# Patient Record
Sex: Male | Born: 1955 | Race: White | Hispanic: No | Marital: Married | Smoking: Never smoker
Health system: Southern US, Community
[De-identification: ages and names within clinical notes are randomized; demographics above are authoritative.]

## PROBLEM LIST (undated history)

## (undated) DIAGNOSIS — N400 Enlarged prostate without lower urinary tract symptoms: Secondary | ICD-10-CM

## (undated) DIAGNOSIS — F419 Anxiety disorder, unspecified: Secondary | ICD-10-CM

## (undated) DIAGNOSIS — M722 Plantar fascial fibromatosis: Secondary | ICD-10-CM

## (undated) DIAGNOSIS — I10 Essential (primary) hypertension: Secondary | ICD-10-CM

## (undated) DIAGNOSIS — B001 Herpesviral vesicular dermatitis: Secondary | ICD-10-CM

## (undated) DIAGNOSIS — K219 Gastro-esophageal reflux disease without esophagitis: Secondary | ICD-10-CM

## (undated) HISTORY — DX: Benign prostatic hyperplasia without lower urinary tract symptoms: N40.0

## (undated) HISTORY — DX: Gastro-esophageal reflux disease without esophagitis: K21.9

## (undated) HISTORY — DX: Essential (primary) hypertension: I10

## (undated) HISTORY — DX: Anxiety disorder, unspecified: F41.9

## (undated) HISTORY — DX: Plantar fascial fibromatosis: M72.2

## (undated) HISTORY — DX: Herpesviral vesicular dermatitis: B00.1

---

## 1998-12-22 ENCOUNTER — Encounter: Payer: Self-pay | Admitting: Gastroenterology

## 1998-12-22 ENCOUNTER — Ambulatory Visit (HOSPITAL_COMMUNITY): Admission: RE | Admit: 1998-12-22 | Discharge: 1998-12-22 | Payer: Self-pay | Admitting: Gastroenterology

## 1998-12-22 DIAGNOSIS — K449 Diaphragmatic hernia without obstruction or gangrene: Secondary | ICD-10-CM | POA: Insufficient documentation

## 1999-06-23 ENCOUNTER — Emergency Department (HOSPITAL_COMMUNITY): Admission: EM | Admit: 1999-06-23 | Discharge: 1999-06-23 | Payer: Self-pay | Admitting: *Deleted

## 2005-05-07 ENCOUNTER — Ambulatory Visit: Payer: Self-pay | Admitting: Gastroenterology

## 2005-06-14 ENCOUNTER — Ambulatory Visit: Payer: Self-pay | Admitting: Gastroenterology

## 2005-06-21 ENCOUNTER — Ambulatory Visit: Payer: Self-pay | Admitting: Gastroenterology

## 2005-06-21 DIAGNOSIS — K644 Residual hemorrhoidal skin tags: Secondary | ICD-10-CM | POA: Insufficient documentation

## 2007-09-04 ENCOUNTER — Ambulatory Visit: Payer: Self-pay | Admitting: Gastroenterology

## 2007-12-14 DIAGNOSIS — I10 Essential (primary) hypertension: Secondary | ICD-10-CM

## 2007-12-14 DIAGNOSIS — K219 Gastro-esophageal reflux disease without esophagitis: Secondary | ICD-10-CM

## 2007-12-14 DIAGNOSIS — K222 Esophageal obstruction: Secondary | ICD-10-CM

## 2008-03-11 ENCOUNTER — Telehealth: Payer: Self-pay | Admitting: Gastroenterology

## 2008-04-22 ENCOUNTER — Emergency Department (HOSPITAL_COMMUNITY): Admission: EM | Admit: 2008-04-22 | Discharge: 2008-04-22 | Payer: Self-pay | Admitting: Emergency Medicine

## 2009-01-09 ENCOUNTER — Ambulatory Visit: Payer: Self-pay | Admitting: Internal Medicine

## 2009-01-09 DIAGNOSIS — F411 Generalized anxiety disorder: Secondary | ICD-10-CM

## 2009-01-09 DIAGNOSIS — N400 Enlarged prostate without lower urinary tract symptoms: Secondary | ICD-10-CM

## 2009-02-05 ENCOUNTER — Ambulatory Visit: Payer: Self-pay | Admitting: Internal Medicine

## 2009-02-05 DIAGNOSIS — R209 Unspecified disturbances of skin sensation: Secondary | ICD-10-CM

## 2009-02-05 DIAGNOSIS — J301 Allergic rhinitis due to pollen: Secondary | ICD-10-CM

## 2009-02-07 ENCOUNTER — Telehealth: Payer: Self-pay | Admitting: Internal Medicine

## 2009-02-11 ENCOUNTER — Telehealth: Payer: Self-pay | Admitting: Internal Medicine

## 2009-05-12 ENCOUNTER — Telehealth: Payer: Self-pay | Admitting: Internal Medicine

## 2009-05-21 ENCOUNTER — Ambulatory Visit: Payer: Self-pay | Admitting: Internal Medicine

## 2009-05-21 DIAGNOSIS — M5106 Intervertebral disc disorders with myelopathy, lumbar region: Secondary | ICD-10-CM

## 2009-05-21 DIAGNOSIS — M549 Dorsalgia, unspecified: Secondary | ICD-10-CM | POA: Insufficient documentation

## 2009-06-04 ENCOUNTER — Telehealth: Payer: Self-pay | Admitting: Internal Medicine

## 2010-05-22 ENCOUNTER — Encounter: Payer: Self-pay | Admitting: Internal Medicine

## 2010-05-22 ENCOUNTER — Ambulatory Visit: Payer: Self-pay | Admitting: Internal Medicine

## 2010-05-22 LAB — CONVERTED CEMR LAB
ALT: 50 units/L (ref 0–53)
AST: 23 units/L (ref 0–37)
Albumin: 4.3 g/dL (ref 3.5–5.2)
Alkaline Phosphatase: 59 units/L (ref 39–117)
Basophils Absolute: 0 10*3/uL (ref 0.0–0.1)
Basophils Relative: 0.3 % (ref 0.0–3.0)
Bilirubin Urine: NEGATIVE
Bilirubin, Direct: 0.2 mg/dL (ref 0.0–0.3)
Cholesterol: 173 mg/dL (ref 0–200)
Eosinophils Absolute: 0 10*3/uL (ref 0.0–0.7)
Eosinophils Relative: 0.3 % (ref 0.0–5.0)
HCT: 47.2 % (ref 39.0–52.0)
HDL: 24.5 mg/dL — ABNORMAL LOW (ref >39.00)
Hemoglobin, Urine: NEGATIVE
Hemoglobin: 16.5 g/dL (ref 13.0–17.0)
Ketones, ur: NEGATIVE mg/dL
LDL Cholesterol: 113 mg/dL — ABNORMAL HIGH (ref 0–99)
Leukocytes, UA: NEGATIVE
Lymphocytes Relative: 20.6 % (ref 12.0–46.0)
Lymphs Abs: 1.9 10*3/uL (ref 0.7–4.0)
MCHC: 35 g/dL (ref 30.0–36.0)
MCV: 85.7 fL (ref 78.0–100.0)
Monocytes Absolute: 0.8 10*3/uL (ref 0.1–1.0)
Monocytes Relative: 8.2 % (ref 3.0–12.0)
Neutro Abs: 6.6 10*3/uL (ref 1.4–7.7)
Neutrophils Relative %: 70.6 % (ref 43.0–77.0)
Nitrite: NEGATIVE
PSA: 0.77 ng/mL (ref 0.10–4.00)
Platelets: 220 10*3/uL (ref 150.0–400.0)
RBC: 5.51 M/uL (ref 4.22–5.81)
RDW: 13.1 % (ref 11.5–14.6)
Specific Gravity, Urine: 1.025 (ref 1.000–1.030)
TSH: 1.3 microintl units/mL (ref 0.35–5.50)
Total Bilirubin: 1 mg/dL (ref 0.3–1.2)
Total CHOL/HDL Ratio: 7
Total Protein, Urine: NEGATIVE mg/dL
Total Protein: 6.7 g/dL (ref 6.0–8.3)
Triglycerides: 180 mg/dL — ABNORMAL HIGH (ref 0.0–149.0)
Urine Glucose: NEGATIVE mg/dL
Urobilinogen, UA: 0.2 (ref 0.0–1.0)
VLDL: 36 mg/dL (ref 0.0–40.0)
WBC: 9.4 10*3/uL (ref 4.5–10.5)
pH: 6.5 (ref 5.0–8.0)

## 2010-11-16 ENCOUNTER — Encounter: Payer: Self-pay | Admitting: Internal Medicine

## 2010-11-24 NOTE — Letter (Signed)
Summary: Lipid Letter  [facility]  [address] [place], [place] [address]   Phone: [phone]  Fax: [phone]    05/22/2010  Bradley Conner [address] [place], [place]  [address]  Dear Jonny Ruiz:  We have carefully reviewed your last lipid profile from 05/22/2010 and the results are noted below with a summary of recommendations for lipid management.    Cholesterol:       173     Goal: <200   HDL "good" Cholesterol:   57.84     Goal: >40   LDL "bad" Cholesterol:   113     Goal: <130   Triglycerides:       180.0     Goal: <150    other labs look normal, the triglycerides are too high    TLC Diet (Therapeutic Lifestyle Change): Saturated Fats & Transfatty acids should be kept < 7% of total calories ***Reduce Saturated Fats Polyunstaurated Fat can be up to 10% of total calories Monounsaturated Fat Fat can be up to 20% of total calories Total Fat should be no greater than 25-35% of total calories Carbohydrates should be 50-60% of total calories Protein should be approximately 15% of total calories Fiber should be at least 20-30 grams a day ***Increased fiber may help lower LDL Total Cholesterol should be < 200mg /day Consider adding plant stanol/sterols to diet (example: Benacol spread) ***A higher intake of unsaturated fat may reduce Triglycerides and Increase HDL    Adjunctive Measures (may lower LIPIDS and reduce risk of Heart Attack) include: Aerobic Exercise (20-30 minutes 3-4 times a week) Limit Alcohol Consumption Weight Reduction Aspirin 75-81 mg a day by mouth (if not allergic or contraindicated) Dietary Fiber 20-30 grams a day by mouth     Current Medications: 1)    Omeprazole 20 Mg  Cpdr (Omeprazole) .Marland Kitchen.. 1 each day 30 minutes before meal 2)    Lisinopril 40 Mg Tabs (Lisinopril) .... Take 1 tablet by mouth once a day 3)    Valtrex 1 Gm Tabs (Valacyclovir hcl) .... One by mouth bid 4)    Lodrane 24d 12-90 Mg Xr24h-cap (Brompheniramine-pseudoeph)  .Marland Kitchen.. 1-2 by mouth once daily as needed nasal congesion 5)    Vicodin Es 7.5-750 Mg Tabs (Hydrocodone-acetaminophen) .... One by mouth qid as needed for pain  If you have any questions, please call. We appreciate being able to work with you.   Sincerely,    [facility] Etta Grandchild MD

## 2010-11-24 NOTE — Assessment & Plan Note (Signed)
Summary: new / cpx / will come fasting/cd   Vital Signs:  Patient profile:   55 year old male Height:      67 inches Weight:      193 pounds BMI:     30.34 O2 Sat:      98 % on Room air Temp:     98.4 degrees F oral Pulse rate:   55 / minute Pulse rhythm:   regular Resp:     16 per minute BP sitting:   126 / 80  (left arm) Cuff size:   large  Vitals Entered By: Rock Nephew CMA (May 22, 2010 9:01 AM)  Nutrition Counseling: Patient's BMI is greater than 25 and therefore counseled on weight management options.  O2 Flow:  Room air CC: Pt here for CPX w/ labs also get med refills, Abdominal Pain, Preventive Care Is Patient Diabetic? No Pain Assessment Patient in pain? no        Primary Care Provider:  Etta Grandchild MD  CC:  Pt here for CPX w/ labs also get med refills, Abdominal Pain, and Preventive Care.  History of Present Illness: He returns for a complete physical and he says that he saw  Dr. Farris Has, a pain specialst, this week and he got some steroid shots.  Dyspepsia History:      He has no alarm features of dyspepsia including no history of melena, hematochezia, dysphagia, persistent vomiting, or involuntary weight loss > 5%.  There is a prior history of GERD.  The patient does not have a prior history of documented ulcer disease.  The dominant symptom is heartburn or acid reflux.  An H-2 blocker medication is currently being taken.  He notes that the symptoms have improved with the H-2 blocker therapy.  Symptoms have not persisted after 4 weeks of H-2 blocker treatment.     Preventive Screening-Counseling & Management  Alcohol-Tobacco     Alcohol drinks/day: <1     Alcohol type: spirits     >5/day in last 3 mos: no     Alcohol Counseling: not indicated; use of alcohol is not excessive or problematic     Feels need to cut down: no     Feels annoyed by complaints: no     Feels guilty re: drinking: no     Needs 'eye opener' in am: no     Smoking Status:  never  Hep-HIV-STD-Contraception     Hepatitis Risk: no risk noted     HIV Risk: no risk noted     STD Risk: no risk noted  Safety-Violence-Falls     Seat Belt Use: yes     Helmet Use: yes     Firearms in the Home: no firearms in the home     Smoke Detectors: yes     Violence in the Home: no risk noted     Sexual Abuse: no      Sexual History:  currently monogamous.        Drug Use:  never.        Blood Transfusions:  no.    Medications Prior to Update: 1)  Omeprazole 20 Mg  Cpdr (Omeprazole) .Marland Kitchen.. 1 Each Day 30 Minutes Before Meal 2)  Lisinopril 40 Mg Tabs (Lisinopril) .... Take 1 Tablet By Mouth Once A Day 3)  Valtrex 1 Gm Tabs (Valacyclovir Hcl) .... One By Mouth Bid 4)  Lorazepam 0.5 Mg Tabs (Lorazepam) .... One By Mouth Three Times A Day Orn Anxiety 5)  Lodrane  24d 12-90 Mg Xr24h-Cap (Brompheniramine-Pseudoeph) .Marland Kitchen.. 1-2 By Mouth Once Daily As Needed Nasal Congesion 6)  Hydrocodone-Homatropine 5-1.5 Mg/88ml Syrp (Hydrocodone-Homatropine) .... 5 Ml By Mouth Q 6 Hours As Needed Cough 7)  Vicodin Es 7.5-750 Mg Tabs (Hydrocodone-Acetaminophen) .... One By Mouth Qid As Needed For Pain 8)  Diclofenac Sodium 50 Mg Tbec (Diclofenac Sodium) .Marland Kitchen.. 1 By Mouth Qid As Needed For Low Back Pain  Current Medications (verified): 1)  Omeprazole 20 Mg  Cpdr (Omeprazole) .Marland Kitchen.. 1 Each Day 30 Minutes Before Meal 2)  Lisinopril 40 Mg Tabs (Lisinopril) .... Take 1 Tablet By Mouth Once A Day 3)  Valtrex 1 Gm Tabs (Valacyclovir Hcl) .... One By Mouth Bid 4)  Lodrane 24d 12-90 Mg Xr24h-Cap (Brompheniramine-Pseudoeph) .Marland Kitchen.. 1-2 By Mouth Once Daily As Needed Nasal Congesion 5)  Vicodin Es 7.5-750 Mg Tabs (Hydrocodone-Acetaminophen) .... One By Mouth Qid As Needed For Pain  Allergies (verified): 1)  Sulfamethoxazole (Sulfamethoxazole)  Past History:  Past Medical History: Last updated: 01/09/2009 GERD Hypertension Genital herpes Cold sores Palntar Fasciitis Anxiety Benign prostatic  hypertrophy  Past Surgical History: Last updated: 01/09/2009 Denies surgical history  Family History: Last updated: 01/09/2009 Family History of Arthritis Family History Hypertension  Social History: Last updated: 01/09/2009 Occupation: Curator Married Never Smoked Alcohol use-yes Drug use-no Regular exercise-yes  Risk Factors: Alcohol Use: <1 (05/22/2010) >5 drinks/d w/in last 3 months: no (05/22/2010) Exercise: yes (01/09/2009)  Risk Factors: Smoking Status: never (05/22/2010)  Family History: Reviewed history from 01/09/2009 and no changes required. Family History of Arthritis Family History Hypertension  Social History: Reviewed history from 01/09/2009 and no changes required. Occupation: Curator Married Never Smoked Alcohol use-yes Drug use-no Regular Research scientist (life sciences) Use:  yes Drug Use:  never  Review of Systems       The patient complains of weight gain.  The patient denies anorexia, fever, weight loss, hoarseness, chest pain, syncope, dyspnea on exertion, peripheral edema, prolonged cough, headaches, hemoptysis, abdominal pain, melena, hematochezia, severe indigestion/heartburn, hematuria, incontinence, muscle weakness, suspicious skin lesions, difficulty walking, depression, unusual weight change, abnormal bleeding, enlarged lymph nodes, angioedema, and testicular masses.   GU:  Denies decreased libido, discharge, dysuria, erectile dysfunction, genital sores, hematuria, incontinence, nocturia, urinary frequency, and urinary hesitancy. MS:  Complains of low back pain; denies joint pain, joint redness, joint swelling, loss of strength, muscle aches, and muscle weakness.  Physical Exam  General:  Well-developed, well-nourished, in no acute distress; alert and oriented x 3.   Head:  normocephalic and atraumatic.   Eyes:  vision grossly intact, pupils equal, pupils round, and pupils reactive to light.   Nose:  External nasal examination shows no  deformity or inflammation. Nasal mucosa are pink and moist without lesions or exudates. Mouth:  Oral mucosa and oropharynx without lesions or exudates.  Teeth in good repair. Neck:  supple, full ROM, no masses, no cervical lymphadenopathy, and no neck tenderness.   Breasts:  no gynecomastia, no masses, no adenopathy, no swelling, no nipple discharge, no tenderness, and no inflammation.   Lungs:  Normal respiratory effort, chest expands symmetrically. Lungs are clear to auscultation, no crackles or wheezes. Heart:  Normal rate and regular rhythm. S1 and S2 normal without gallop, murmur, click, rub or other extra sounds. Abdomen:  Bowel sounds positive,abdomen soft and non-tender without masses, organomegaly or hernias noted. Rectal:  normal sphincter tone, no masses, no tenderness, no fissures, no fistulae, no perianal rash, and external hemorrhoid(s).   Genitalia:  uncircumcised, no hydrocele, no varicocele, no  scrotal masses, no testicular masses or atrophy, no cutaneous lesions, and no urethral discharge.   Prostate:  no nodules, no asymmetry, no induration, and 1+ enlarged.   Msk:  normal ROM, no joint tenderness, no joint swelling, no joint warmth, no redness over joints, no joint deformities, no joint instability, no crepitation, and no muscle atrophy.   Pulses:  R and L carotid,radial,femoral,dorsalis pedis and posterior tibial pulses are full and equal bilaterally Extremities:  No clubbing, cyanosis, edema, or deformity noted with normal full range of motion of all joints.   Neurologic:  No cranial nerve deficits noted. Station and gait are normal. Plantar reflexes are down-going bilaterally. DTRs are symmetrical throughout. Sensory, motor and coordinative functions appear intact. Skin:  turgor normal, color normal, no rashes, no suspicious lesions, no ecchymoses, no petechiae, no purpura, no ulcerations, and no edema.   Cervical Nodes:  no anterior cervical adenopathy and no posterior cervical  adenopathy.   Axillary Nodes:  no R axillary adenopathy and no L axillary adenopathy.   Inguinal Nodes:  no R inguinal adenopathy and no L inguinal adenopathy.   Psych:  Cognition and judgment appear intact. Alert and cooperative with normal attention span and concentration. No apparent delusions, illusions, hallucinations   Impression & Recommendations:  Problem # 1:  ROUTINE GENERAL MEDICAL EXAM@HEALTH  CARE FACL (ICD-V70.0) Assessment New  Orders: Venipuncture (83151) TLB-Lipid Panel (80061-LIPID) TLB-CBC Platelet - w/Differential (85025-CBCD) TLB-Hepatic/Liver Function Pnl (80076-HEPATIC) TLB-TSH (Thyroid Stimulating Hormone) (84443-TSH) TLB-PSA (Prostate Specific Antigen) (84153-PSA) TLB-Udip w/ Micro (81001-URINE) EKG w/ Interpretation (93000) Hemoccult Guaiac-1 spec.(in office) (76160)  Colonoscopy: done (06/21/2005) Td Booster: Historical (10/25/2009)    Discussed using sunscreen, use of alcohol, drug use, self testicular exam, routine dental care, routine eye care, routine physical exam, seat belts, multiple vitamins, and recommendations for immunizations.  Discussed exercise and checking cholesterol.   Also recommend checking PSA.  Complete Medication List: 1)  Omeprazole 20 Mg Cpdr (Omeprazole) .Marland Kitchen.. 1 each day 30 minutes before meal 2)  Lisinopril 40 Mg Tabs (Lisinopril) .... Take 1 tablet by mouth once a day 3)  Valtrex 1 Gm Tabs (Valacyclovir hcl) .... One by mouth bid 4)  Lodrane 24d 12-90 Mg Xr24h-cap (Brompheniramine-pseudoeph) .Marland Kitchen.. 1-2 by mouth once daily as needed nasal congesion 5)  Vicodin Es 7.5-750 Mg Tabs (Hydrocodone-acetaminophen) .... One by mouth qid as needed for pain  Dyspepsia Assessment/Plan:  Step Therapy: GERD Treatment Protocols:    Step-1: improved  Colorectal Screening:  Current Recommendations:    Hemoccult: NEG X 1 today  PSA Screening:    Reviewed PSA screening recommendations: PSA ordered  Immunization & Chemoprophylaxis:     Tetanus vaccine: Historical  (10/25/2009)  Patient Instructions: 1)  It is important that you exercise regularly at least 20 minutes 5 times a week. If you develop chest pain, have severe difficulty breathing, or feel very tired , stop exercising immediately and seek medical attention. 2)  You need to lose weight. Consider a lower calorie diet and regular exercise.  3)  Check your Blood Pressure regularly. If it is above 140/90: you should make an appointment. 4)  Please schedule a follow-up appointment in 4 months. Prescriptions: VICODIN ES 7.5-750 MG TABS (HYDROCODONE-ACETAMINOPHEN) One by mouth QID as needed for pain  #50 x 1   Entered and Authorized by:   Etta Grandchild MD   Signed by:   Etta Grandchild MD on 05/22/2010   Method used:   Print then Give to Patient   RxID:  [id number] VALTREX 1 GM TABS (VALACYCLOVIR HCL) one by mouth bid  #60 x 11   Entered and Authorized by:   Etta Grandchild MD   Signed by:   Etta Grandchild MD on 05/22/2010   Method used:   Print then Give to Patient   RxID:   [id number] LISINOPRIL 40 MG TABS (LISINOPRIL) Take 1 tablet by mouth once a day  #30 Tablet x 11   Entered and Authorized by:   Etta Grandchild MD   Signed by:   Etta Grandchild MD on 05/22/2010   Method used:   Print then Give to Patient   RxID:   [id number] OMEPRAZOLE 20 MG  CPDR (OMEPRAZOLE) 1 each day 30 minutes before meal  #30 Capsule x 11   Entered and Authorized by:   Etta Grandchild MD   Signed by:   Etta Grandchild MD on 05/22/2010   Method used:   Print then Give to Patient   RxID:   [id number]   Preventive Care Screening  Last Tetanus Booster:    Date:  10/25/2009    Results:  Historical   Colonoscopy:    Date:  06/21/2005    Results:  done

## 2010-11-30 ENCOUNTER — Encounter (HOSPITAL_BASED_OUTPATIENT_CLINIC_OR_DEPARTMENT_OTHER)
Admission: RE | Admit: 2010-11-30 | Discharge: 2010-11-30 | Disposition: A | Discharge status: Home / Self Care | Payer: 59 | Source: Ambulatory Visit | Attending: Orthopedic Surgery | Admitting: Orthopedic Surgery

## 2010-11-30 DIAGNOSIS — Z01812 Encounter for preprocedural laboratory examination: Secondary | ICD-10-CM | POA: Insufficient documentation

## 2010-11-30 LAB — BASIC METABOLIC PANEL
BUN: 11 mg/dL (ref 6–23)
CO2: 28 mEq/L (ref 19–32)
Calcium: 9 mg/dL (ref 8.4–10.5)
Chloride: 106 mEq/L (ref 96–112)
Creatinine, Ser: 1 mg/dL (ref 0.4–1.5)
GFR calc Af Amer: >60 mL/min (ref >60)
GFR calc non Af Amer: >60 mL/min (ref >60)
Glucose, Bld: 94 mg/dL (ref 70–99)
Potassium: 4.8 mEq/L (ref 3.5–5.1)
Sodium: 141 mEq/L (ref 135–145)

## 2010-12-03 ENCOUNTER — Ambulatory Visit (HOSPITAL_BASED_OUTPATIENT_CLINIC_OR_DEPARTMENT_OTHER)
Admission: RE | Admit: 2010-12-03 | Discharge: 2010-12-03 | Disposition: A | Discharge status: Home / Self Care | Payer: 59 | Source: Ambulatory Visit | Attending: Orthopedic Surgery | Admitting: Orthopedic Surgery

## 2010-12-03 DIAGNOSIS — M7500 Adhesive capsulitis of unspecified shoulder: Secondary | ICD-10-CM | POA: Insufficient documentation

## 2010-12-03 DIAGNOSIS — M25819 Other specified joint disorders, unspecified shoulder: Secondary | ICD-10-CM | POA: Insufficient documentation

## 2010-12-03 DIAGNOSIS — M948X9 Other specified disorders of cartilage, unspecified sites: Secondary | ICD-10-CM | POA: Insufficient documentation

## 2010-12-03 DIAGNOSIS — M719 Bursopathy, unspecified: Secondary | ICD-10-CM | POA: Insufficient documentation

## 2010-12-03 DIAGNOSIS — Z01818 Encounter for other preprocedural examination: Secondary | ICD-10-CM | POA: Insufficient documentation

## 2010-12-03 DIAGNOSIS — M67919 Unspecified disorder of synovium and tendon, unspecified shoulder: Secondary | ICD-10-CM | POA: Insufficient documentation

## 2010-12-03 DIAGNOSIS — Z01812 Encounter for preprocedural laboratory examination: Secondary | ICD-10-CM | POA: Insufficient documentation

## 2010-12-14 NOTE — Op Note (Signed)
  NAME:  AMARU, BURROUGHS NO.:  [id number]  MEDICAL RECORD NO.:  [id number]          PATIENT TYPE:  LOCATION:                                 FACILITY:  PHYSICIAN:  Loreta Ave, M.D.      DATE OF BIRTH:  DATE OF PROCEDURE:  12/03/2010 DATE OF DISCHARGE:                              OPERATIVE REPORT   PREOPERATIVE DIAGNOSES:  Left shoulder subacromial impingement, rotator cuff tendonitis, marked distal clavicle osteolysis.  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  PROCEDURES:  Left shoulder exam, manipulation under anesthesia. Arthroscopy with lysis and debridement of the intra and extra-articular adhesions and synovitis.  Debridement of small anterior labrum tear. Bursectomy, acromioplasty, coracoacromial ligament release.  Excision of distal clavicle.  SURGEON:  Loreta Ave, MD  ASSISTANT:  Genene Churn. Barry Dienes, [place], present throughout the entire case and necessary for timely completion of procedure.  ANESTHESIA:  General.  BLOOD LOSS:  Minimal.  SPECIMEN:  None.  CULTURE:  None.  COMPLICATIONS:  None.  DRESSING:  Soft compressive with sling.  PROCEDURE:  The patient was brought to the operating room, placed on the operating table in supine position.  After adequate anesthesia has been obtained, left shoulder examined.  He lacked 40% of motion especially trying to go overhead.  Relatively abrupt endpoint.  Considerably stiffer and more frozen than when I saw him one month ago.  He was manipulated, break up obvious adhesions achieving full motion, stable shoulder.  Placed in a beach-chair position on the shoulder positioner and prepped and draped in the usual sterile fashion.  Three portals, anterior, posterior, and lateral.  Arthroscope was then introduced,  the shoulder was distended and inspected.  Marked intra-articular synovitis adhesions debrided.  The scarring and tearing of the inferior capsule obvious from manipulation as was necessary.  Debridement of small tearing of anterior labrum.  Biceps tendon, undersurface cuff, articular cartilage all looked good.  After clearing out the inside of the shoulder, it looked subacromial.  Type 3 acromion, marked reactive bursitis, adhesions there.  Bursa resected, cuff debrided, and adhesions removed.  Some roughening on the top of the cuff but structurally intact.  Acromioplasty from type 3 to type 1 acromion with shaver and high-speed bur releasing the CA ligament.  Grade 4 changes and marked spurring AC joint.  Periarticular spurs and lateral centimeter of clavicle resected.  Adequacy of decompression, debridement confirmed viewing from all portals.  Instruments and fluid removed.  Portals and shoulder bursa injected with Marcaine.  Portals closed with 4-0 nylon. Sterile compressive dressing applied.  Sling applied.  Anesthesia reversed.  Brought to recovery room.  Tolerated surgery well.  No complications.     Loreta Ave, M.D.     DFM/MEDQ  D:  12/03/2010  T:  12/04/2010  Job:  [id number]  Electronically Signed by Mckinley Jewel M.D. on 12/14/2010 02:58:30 PM

## 2011-02-05 ENCOUNTER — Ambulatory Visit (INDEPENDENT_AMBULATORY_CARE_PROVIDER_SITE_OTHER): Payer: 59 | Admitting: Internal Medicine

## 2011-02-05 ENCOUNTER — Encounter: Payer: Self-pay | Admitting: Internal Medicine

## 2011-02-05 VITALS — BP 126/88 | HR 68 | Temp 98.5°F | Resp 16 | Ht 67.0 in | Wt 200.5 lb

## 2011-02-05 DIAGNOSIS — J309 Allergic rhinitis, unspecified: Secondary | ICD-10-CM

## 2011-02-05 DIAGNOSIS — I10 Essential (primary) hypertension: Secondary | ICD-10-CM

## 2011-02-05 MED ORDER — FLUTICASONE PROPIONATE 50 MCG/ACT NA SUSP: 2.0000 | Freq: Every day | NASAL | Status: DC

## 2011-02-05 NOTE — Progress Notes (Signed)
Subjective:    Patient ID: Bradley Conner, male    DOB: Apr 29, 1956, 55 y.o.   MRN: [id number]  Allergic Reaction This is a chronic problem. The current episode started 5 to 7 days ago. The problem occurs daily. The problem has been gradually worsening since onset. The problem is moderate. The time of exposure was just prior to onset. Associated symptoms include eye itching and eye watering. Pertinent negatives include no abdominal pain, chest pain, chest pressure, coughing, diarrhea, difficulty breathing, drooling, eye redness, globus sensation, hyperventilation, itching, rash, stridor, trouble swallowing, vomiting or wheezing. There is no swelling present. Past treatments include one or more OTC medications and diphenhydramine. The treatment provided mild relief. His past medical history is significant for seasonal allergies. There is no history of asthma, atopic dermatitis, food allergies or medication allergies.      Review of Systems  Constitutional: Negative for fever, chills, diaphoresis, activity change, appetite change, fatigue and unexpected weight change.  HENT: Positive for congestion, rhinorrhea, sneezing and postnasal drip. Negative for hearing loss, ear pain, nosebleeds, sore throat, facial swelling, drooling, trouble swallowing, neck pain, neck stiffness, voice change, sinus pressure and ear discharge.   Eyes: Positive for itching. Negative for redness.  Respiratory: Negative for apnea, cough, choking, chest tightness, shortness of breath, wheezing and stridor.   Cardiovascular: Negative for chest pain, palpitations and leg swelling.  Gastrointestinal: Negative for nausea, vomiting, abdominal pain, diarrhea, constipation, blood in stool and anal bleeding.  Musculoskeletal: Negative for myalgias, back pain, joint swelling, arthralgias and gait problem.  Skin: Negative for color change, itching, pallor and rash.  Neurological: Negative for dizziness, tremors, seizures, syncope, facial  asymmetry, speech difficulty, weakness, light-headedness, numbness and headaches.  Hematological: Negative for adenopathy. Does not bruise/bleed easily.  Psychiatric/Behavioral: Negative for dysphoric mood, decreased concentration and agitation. The patient is not nervous/anxious.        Objective:   Physical Exam  [nursing notereviewed. Constitutional: He is oriented to person, place, and time. He appears well-developed and well-nourished. No distress.  HENT:  Head: Normocephalic and atraumatic.  Right Ear: External ear normal.  Left Ear: External ear normal.  Nose: Mucosal edema and rhinorrhea present. No sinus tenderness, nasal deformity, septal deviation or nasal septal hematoma. No epistaxis.  No foreign bodies. Right sinus exhibits no maxillary sinus tenderness and no frontal sinus tenderness. Left sinus exhibits no maxillary sinus tenderness and no frontal sinus tenderness.  Mouth/Throat: Oropharynx is clear and moist. No oropharyngeal exudate.  Eyes: Conjunctivae and EOM are normal. Pupils are equal, round, and reactive to light. Right eye exhibits no discharge. Left eye exhibits no discharge. No scleral icterus.  Neck: Normal range of motion. Neck supple. No JVD present. No tracheal deviation present. No thyromegaly present.  Cardiovascular: Normal rate, regular rhythm, normal heart sounds and intact distal pulses.  Exam reveals no gallop and no friction rub.   No murmur heard. Pulmonary/Chest: Effort normal and breath sounds normal. No respiratory distress. He has no wheezes. He has no rales. He exhibits no tenderness.  Abdominal: Soft. Bowel sounds are normal. He exhibits no distension and no mass. There is no tenderness. There is no rebound and no guarding.  Musculoskeletal: Normal range of motion. He exhibits no edema and no tenderness.  Lymphadenopathy:    He has no cervical adenopathy.  Neurological: He is alert and oriented to person, place, and time. He has normal reflexes.    Skin: Skin is warm and dry. No rash noted. He is not diaphoretic. No  erythema. No pallor.  Psychiatric: He has a normal mood and affect. His behavior is normal. Judgment and thought content normal.       Lab Results  Component Value Date   WBC 9.4 05/22/2010   HGB 17.3* 12/03/2010   HCT 47.2 05/22/2010   PLT 220.0 05/22/2010   CHOL 173 05/22/2010   TRIG 180.0* 05/22/2010   HDL 24.50* 05/22/2010   ALT 50 05/22/2010   AST 23 05/22/2010   NA 141 11/30/2010   K 4.8 11/30/2010   CL 106 11/30/2010   CREATININE 1.00 11/30/2010   BUN 11 11/30/2010   CO2 28 11/30/2010   TSH 1.30 05/22/2010   PSA 0.77 05/22/2010      Assessment & Plan:

## 2011-02-05 NOTE — Patient Instructions (Signed)
Allergic Rhinitis Allergic rhinitis is when the mucous membranes in the nose respond to allergens. Allergens are particles in the air that cause your body to have an allergic reaction. This causes you to release allergic antibodies. Through a chain of events, these eventually cause you to release histamine into the blood stream (hence the use of antihistamines). Although meant to be protective to the body, it is this release that causes your discomfort, such as frequent sneezing, congestion and an itchy runny nose.  CAUSES The pollen allergens may come from grasses, trees, and weeds. This is seasonal allergic rhinitis, or "hay fever." Other allergens cause year-round allergic rhinitis (perennial allergic rhinitis) such as house dust mite allergen, pet dander and mold spores.  SYMPTOMS  Nasal stuffiness (congestion).   Runny, itchy nose with sneezing and tearing of the eyes.   There is often an itching of the mouth, eyes and ears.  It cannot be cured, but it can be controlled with medications. DIAGNOSIS If you are unable to determine the offending allergen, skin or blood testing may find it. TREATMENT  Avoid the allergen.   Medications and allergy shots (immunotherapy) can help.   Hay fever may often be treated with antihistamines in pill or nasal spray forms. Antihistamines block the effects of histamine. There are over-the-counter medicines that may help with nasal congestion and swelling around the eyes. Check with your caregiver before taking or giving this medicine.  If the treatment above does not work, there are many new medications your caregiver can prescribe. Stronger medications may be used if initial measures are ineffective. Desensitizing injections can be used if medications and avoidance fails. Desensitization is when a patient is given ongoing shots until the body becomes less sensitive to the allergen. Make sure you follow up with your caregiver if problems continue. SEEK  MEDICAL CARE IF:   You develop fever (more than 100.5F (38.1 C).   You develop a cough that does not stop easily (persistent).   You have shortness of breath.   You start wheezing.   Symptoms interfere with normal daily activities.  Document Released: 07/06/2001 Document Re-Released: 11/02/2009 [employer] Patient Information 2011 [employer]. 

## 2011-02-05 NOTE — Assessment & Plan Note (Signed)
His BP is well controlled 

## 2011-02-05 NOTE — Assessment & Plan Note (Signed)
Due to symptom severity and failure of routine meds he was given a dose of depo-medrol and started on flonase ns.

## 2011-03-09 NOTE — Assessment & Plan Note (Signed)
HEALTHCARE                         GASTROENTEROLOGY OFFICE NOTE   BRYSON, GAVIA                       MRN:          [id number]  DATE:09/04/2007                            DOB:          [birth year]    HISTORY:  Mr. Haven is a 55 year old white male who has a long history  of acid reflux which required endoscopic evaluation with esophageal  dilatation in February 2008.  Since that time, he has been on regular  PPI therapy.  He was actually last seen in our offices in July 2006.  He  was doing well at that time.  He had some asymptomatic rectal bleeding  and underwent a colonoscopy on June 21, 2005, which showed some  external hemorrhoids, but otherwise was negative.   The patient has been taking Aciphex every other day and recently ran out  of his medication.  He had recurrent burning substernal chest pain, but  denies true dysphagia.  He denies any other gastrointestinal symptoms  since his abdominal pain, change in bowel habits, melena or  hematochezia.  He follows a regular diet and has no specific food  intolerances.  He is followed at [facility] and [facility] and  recently has been diagnosed as having mild essential hypertension.  He  is on an unknown blood pressure pill.   PAST MEDICAL HISTORY:  Is otherwise noncontributory.   FAMILY HISTORY:  Noncontributory.   SOCIAL HISTORY:  He is married and lives with his wife.  He has a  Restaurant manager, fast food position.  He does not smoke and uses ethanol socially.   REVIEW OF SYSTEMS:  Noncontributory.   CURRENT MEDICATIONS:  1. Aciphex 20 mg daily.  2. An unknown blood pressure pill.   ALLERGIES:  He denies allergy except to SULFA MEDICATIONS.   PHYSICAL EXAMINATION:  GENERAL:  He is a healthy-appearing white male,  in no distress, appearing his stated age.  HEENT:  I cannot appreciate stigmata of chronic liver disease.  VITAL SIGNS:  He is 5 feet 7 inches in height, weight 200 pounds.   Blood  pressure 130/84, pulse 62 and regular.  CHEST:  Clear.  HEART:  A regular rhythm without murmurs, gallops or rubs.  ABDOMEN:  There was no hepatosplenomegaly, abdominal masses or  tenderness.  Bowel sounds were normal.  NEUROLOGIC:  Mental status clear.  EXTREMITIES:  Peripheral extremities were unremarkable.   ASSESSMENT/PLAN:  1. Chronic acid reflux, doing well on regular proton pump inhibitor      therapy.  There has been no previous evidence of Barrett's mucosa,      and the patient does not have dysphagia at this time.  2. The patient is up-to-date on his colonoscopy examinations.  3. New onset hypertension which appears well-controlled at this time.      The patient relates that he has had recent blood work done at [facility].  We will request a copy of this for our records.   RECOMMENDATIONS:  1. Reflux regimen again reviewed with the patient.  2. Continue Aciphex 20 mg, 30 minutes before his  first meal of the      day.  3. GI followup p.r.n.     Vania Rea. Jarold Motto, MD, Caleen Essex, FAGA  Electronically Signed    DRP/MedQ  DD: 09/04/2007  DT: 09/04/2007  Job #: [id number]   cc:   [facility]

## 2011-04-07 ENCOUNTER — Other Ambulatory Visit (INDEPENDENT_AMBULATORY_CARE_PROVIDER_SITE_OTHER): Payer: 59

## 2011-04-07 ENCOUNTER — Encounter: Payer: Self-pay | Admitting: Internal Medicine

## 2011-04-07 ENCOUNTER — Ambulatory Visit (INDEPENDENT_AMBULATORY_CARE_PROVIDER_SITE_OTHER): Payer: 59 | Admitting: Internal Medicine

## 2011-04-07 VITALS — BP 122/80 | HR 58 | Temp 98.0°F | Resp 16 | Wt 196.0 lb

## 2011-04-07 DIAGNOSIS — E782 Mixed hyperlipidemia: Secondary | ICD-10-CM

## 2011-04-07 DIAGNOSIS — I10 Essential (primary) hypertension: Secondary | ICD-10-CM

## 2011-04-07 LAB — LIPID PANEL
HDL: 22.7 mg/dL — ABNORMAL LOW (ref >39.00)
LDL Cholesterol: 94 mg/dL (ref 0–99)
Total CHOL/HDL Ratio: 7
Triglycerides: 177 mg/dL — ABNORMAL HIGH (ref 0.0–149.0)
VLDL: 35.4 mg/dL (ref 0.0–40.0)

## 2011-04-07 LAB — COMPREHENSIVE METABOLIC PANEL
ALT: 29 U/L (ref 0–53)
AST: 23 U/L (ref 0–37)
Alkaline Phosphatase: 77 U/L (ref 39–117)
Creatinine, Ser: 1 mg/dL (ref 0.4–1.5)
Total Bilirubin: 0.7 mg/dL (ref 0.3–1.2)

## 2011-04-07 NOTE — Progress Notes (Signed)
Subjective:    Patient ID: Bradley Conner, male    DOB: January 10, 1956, 55 y.o.   MRN: [id number]  Hypertension This is a chronic problem. The current episode started more than 1 year ago. The problem has been gradually improving since onset. The problem is controlled. Pertinent negatives include no anxiety, blurred vision, chest pain, headaches, malaise/fatigue, neck pain, orthopnea, palpitations, peripheral edema, PND, shortness of breath or sweats. There are no associated agents to hypertension. Past treatments include ACE inhibitors. The current treatment provides significant improvement. There are no compliance problems.       Review of Systems  Constitutional: Negative for malaise/fatigue.  HENT: Negative for ear pain, nosebleeds, congestion, sore throat, rhinorrhea, sneezing, mouth sores, trouble swallowing, neck pain, voice change, postnasal drip and sinus pressure.   Eyes: Negative for blurred vision, photophobia, redness and visual disturbance.  Respiratory: Negative for apnea, cough, choking, chest tightness, shortness of breath, wheezing and stridor.   Cardiovascular: Negative for chest pain, palpitations, orthopnea, leg swelling and PND.  Gastrointestinal: Negative for vomiting, abdominal pain, diarrhea and constipation.  Genitourinary: Negative for dysuria, urgency, frequency, hematuria, flank pain, decreased urine volume, enuresis and difficulty urinating.  Musculoskeletal: Negative for myalgias, back pain, joint swelling, arthralgias and gait problem.  Skin: Negative for color change, pallor and rash.  Neurological: Negative for dizziness, tremors, seizures, syncope, facial asymmetry, speech difficulty, weakness, light-headedness, numbness and headaches.  Hematological: Negative for adenopathy. Does not bruise/bleed easily.  Psychiatric/Behavioral: Negative.        Objective:   Physical Exam  Vitals reviewed. Constitutional: He is oriented to person, place, and time. He  appears well-developed and well-nourished. No distress.  HENT:  Head: Normocephalic and atraumatic.  Right Ear: External ear normal.  Left Ear: External ear normal.  Nose: Nose normal.  Mouth/Throat: Oropharynx is clear and moist. No oropharyngeal exudate.  Eyes: Conjunctivae and EOM are normal. Pupils are equal, round, and reactive to light. Right eye exhibits no discharge. Left eye exhibits no discharge. No scleral icterus.  Neck: Normal range of motion. Neck supple. No JVD present. No tracheal deviation present. No thyromegaly present.  Cardiovascular: Normal rate, regular rhythm, normal heart sounds and intact distal pulses.  Exam reveals no gallop and no friction rub.   No murmur heard. Pulmonary/Chest: Effort normal and breath sounds normal. No stridor. No respiratory distress. He has no wheezes. He has no rales. He exhibits no tenderness.  Abdominal: Soft. Bowel sounds are normal. He exhibits no distension and no mass. There is no tenderness. There is no rebound and no guarding.  Musculoskeletal: Normal range of motion. He exhibits no edema and no tenderness.  Lymphadenopathy:    He has no cervical adenopathy.  Neurological: He is alert and oriented to person, place, and time. He has normal reflexes. He displays normal reflexes. No cranial nerve deficit. He exhibits normal muscle tone. Coordination normal.  Skin: Skin is warm and dry. No rash noted. He is not diaphoretic. No erythema. No pallor.  Psychiatric: He has a normal mood and affect. His behavior is normal. Judgment and thought content normal.         Lab Results  Component Value Date   WBC 9.4 05/22/2010   HGB 17.3* 12/03/2010   HCT 47.2 05/22/2010   PLT 220.0 05/22/2010   CHOL 173 05/22/2010   TRIG 180.0* 05/22/2010   HDL 24.50* 05/22/2010   ALT 50 05/22/2010   AST 23 05/22/2010   NA 141 11/30/2010   K 4.8 11/30/2010  CL 106 11/30/2010   CREATININE 1.00 11/30/2010   BUN 11 11/30/2010   CO2 28 11/30/2010   TSH 1.30 05/22/2010    PSA 0.77 05/22/2010   Assessment & Plan:

## 2011-04-07 NOTE — Patient Instructions (Signed)
Hypertension (High Blood Pressure) As your heart beats, it forces blood through your arteries. This force is your blood pressure. If the pressure is too high, it is called hypertension (HTN) or high blood pressure. HTN is dangerous because you may have it and not know it. High blood pressure may mean that your heart has to work harder to pump blood. Your arteries may be narrow or stiff. The extra work puts you at risk for heart disease, stroke, and other problems.  Blood pressure consists of two numbers, a higher number over a lower, 110/72, for example. It is stated as "110 over 72." The ideal is below 120 for the top number (systolic) and under 80 for the bottom (diastolic). Write down your blood pressure today. You should pay close attention to your blood pressure if you have certain conditions such as:  Heart failure.  Prior heart attack.   Diabetes   Chronic kidney disease.   Prior stroke.   Multiple risk factors for heart disease.   To see if you have HTN, your blood pressure should be measured while you are seated with your arm held at the level of the heart. It should be measured at least twice. A one-time elevated blood pressure reading (especially in the Emergency Department) does not mean that you need treatment. There may be conditions in which the blood pressure is different between your right and left arms. It is important to see your caregiver soon for a recheck. Most people have essential hypertension which means that there is not a specific cause. This type of high blood pressure may be lowered by changing lifestyle factors such as:  Stress.  Smoking.   Lack of exercise.   Excessive weight.  Drug/tobacco/alcohol use.   Eating less salt.   Most people do not have symptoms from high blood pressure until it has caused damage to the body. Effective treatment can often prevent, delay or reduce that damage. TREATMENT Treatment for high blood pressure, when a cause has been  identified, is directed at the cause. There are a large number of medications to treat HTN. These fall into several categories, and your caregiver will help you select the medicines that are best for you. Medications may have side effects. You should review side effects with your caregiver. If your blood pressure stays high after you have made lifestyle changes or started on medicines,   Your medication(s) may need to be changed.   Other problems may need to be addressed.   Be certain you understand your prescriptions, and know how and when to take your medicine.   Be sure to follow up with your caregiver within the time frame advised (usually within two weeks) to have your blood pressure rechecked and to review your medications.   If you are taking more than one medicine to lower your blood pressure, make sure you know how and at what times they should be taken. Taking two medicines at the same time can result in blood pressure that is too low.  SEEK IMMEDIATE MEDICAL CARE IF YOU DEVELOP:  A severe headache, blurred or changing vision, or confusion.   Unusual weakness or numbness, or a faint feeling.   Severe chest or abdominal pain, vomiting, or breathing problems.  MAKE SURE YOU:   Understand these instructions.   Will watch your condition.   Will get help right away if you are not doing well or get worse.  Document Released: 10/11/2005 Document Re-Released: 03/31/2010 Exit[employer] Patient Information 2011 [employer].Hypertriglyceridemia  Diet for High blood levels of Triglycerides Most fats in food are triglycerides. Triglycerides in your blood are stored as fat in your body. High levels of triglycerides in your blood may put you at a greater risk for heart disease and stroke.  Normal triglyceride levels are less than 150 mg/dL. Borderline high levels are 150-199 mg/dl. High levels are 200 - 499 mg/dL, and very high triglyceride levels are greater than 500 mg/dL. The decision to  treat high triglycerides is generally based on the level. For people with borderline or high triglyceride levels, treatment includes weight loss and exercise. Drugs are recommended for people with very high triglyceride levels. Many people who need treatment for high triglyceride levels have metabolic syndrome. This syndrome is a collection of disorders that often include: insulin resistance, high blood pressure, blood clotting problems, high cholesterol and triglycerides. TESTING PROCEDURE FOR TRIGLYCERIDES  You should not eat 4 hours before getting your triglycerides measured. The normal range of triglycerides is between 10 and 250 milligrams per deciliter (mg/dl). Some people may have extreme levels (1000 or above), but your triglyceride level may be too high if it is above 150 mg/dl, depending on what other risk factors you have for heart disease.   People with high blood triglycerides may also have high blood cholesterol levels. If you have high blood cholesterol as well as high blood triglycerides, your risk for heart disease is probably greater than if you only had high triglycerides. High blood cholesterol is one of the main risk factors for heart disease.  CHANGING YOUR DIET  Your weight can affect your blood triglyceride level. If you are more than 20% above your ideal body weight, you may be able to lower your blood triglycerides by losing weight. Eating less and exercising regularly is the best way to combat this. Fat provides more calories than any other food. The best way to lose weight is to eat less fat. Only 30% of your total calories should come from fat. Less than 7% of your diet should come from saturated fat. A diet low in fat and saturated fat is the same as a diet to decrease blood cholesterol. By eating a diet lower in fat, you may lose weight, lower your blood cholesterol, and lower your blood triglyceride level.  Eating a diet low in fat, especially saturated fat, may also help you  lower your blood triglyceride level. Ask your dietitian to help you figure how much fat you can eat based on the number of calories your caregiver has prescribed for you.  Exercise, in addition to helping with weight loss may also help lower triglyceride levels.   Alcohol can increase blood triglycerides. You may need to stop drinking alcoholic beverages.   Too much carbohydrate in your diet may also increase your blood triglycerides. Some complex carbohydrates are necessary in your diet. These may include bread, rice, potatoes, other starchy vegetables and cereals.   Reduce "simple" carbohydrates. These may include pure sugars, candy, honey, and jelly without losing other nutrients. If you have the kind of high blood triglycerides that is affected by the amount of carbohydrates in your diet, you will need to eat less sugar and less high-sugar foods. Your caregiver can help you with this.   Adding 2-4 grams of fish oil (EPA+ DHA) may also help lower triglycerides. Speak with your caregiver before adding any supplements to your regimen.  Following the Diet  Maintain your ideal weight. Your caregivers can help you with a diet.   Generally, eating less food and getting more exercise will help you lose weight. Joining a weight control group may also help. Ask your caregivers for a good weight control group in your area.  Eat low-fat foods instead of high-fat foods. This can help you lose weight too.  These foods are lower in fat. Eat MORE of these:   Dried beans, peas, and lentils.   Egg whites.   Low-fat cottage cheese.   Fish.   Lean cuts of meat, such as round, sirloin, rump, and flank (cut extra fat off meat you fix).   Whole grain breads, cereals and pasta.   Skim and nonfat dry milk.   Low-fat yogurt.   Poultry without the skin.   Cheese made with skim or part-skim milk, such as mozzarella, parmesan, farmers', ricotta, or pot cheese.   These are higher fat foods. Eat LESS of these:     Whole milk and foods made from whole milk, such as American, blue, cheddar, monterey jack, and swiss cheese   High-fat meats, such as luncheon meats, sausages, knockwurst, bratwurst, hot dogs, ribs, corned beef, ground pork, and regular ground beef.   Fried foods.  Limit saturated fats in your diet. Substituting unsaturated fat for saturated fat may decrease your blood triglyceride level. You will need to read package labels to know which products contain saturated fats.  These foods are high in saturated fat. Eat LESS of these:   Fried pork skins.  Whole milk.   Skin and fat from poultry.   Palm oil.   Butter.   Shortening.   Cream cheese.   Bacon.   Margarines and baked goods made from listed oils.   Vegetable shortenings.   Chitterlings.  Fat from meats.   Coconut oil.   Palm kernel oil.   Lard.   Cream.   Sour cream.   Fatback.   Coffee whiteners and non-dairy creamers made with these oils.   Cheese made from whole milk.   Use unsaturated fats (both polyunsaturated and monounsaturated) moderately. Remember, even though unsaturated fats are better than saturated fats; you still want a diet low in total fat.  These foods are high in unsaturated fat:   Canola oil.  Sunflower oil.   Mayonnaise.   Almonds.   Peanuts.   Pine nuts.   Margarines made with these oils.   Safflower oil.  Olive oil.   Avocados.   Cashews.   Peanut butter.   Sunflower seeds.   Soybean oil.  Peanut oil.   Olives.   Pecans.   Walnuts.   Pumpkin seeds.   Avoid sugar and other high-sugar foods. This will decrease carbohydrates without decreasing other nutrients. Sugar in your food goes rapidly to your blood. When there is excess sugar in your blood, your liver may use it to make more triglycerides. Sugar also contains calories without other important nutrients.  Eat LESS of these:   Sugar, brown sugar, powdered sugar, jam, jelly, preserves, honey, syrup,  molasses, pies, candy, cakes, cookies, frosting, pastries, colas, soft drinks, punches, fruit drinks, and regular gelatin.   Avoid alcohol. Alcohol, even more than sugar, may increase blood triglycerides. In addition, alcohol is high in calories and low in nutrients. Ask for sparkling water, or a diet soft drink instead of an alcoholic beverage.  Suggestions for planning and preparing meals   Bake, broil, grill or roast meats instead of frying.   Remove fat from meats and skin from poultry before cooking.   Add spices, herbs,   lemon juice or vinegar to vegetables instead of salt, rich sauces or gravies.   Use a non-stick skillet without fat or use no-stick sprays.   Cool and refrigerate stews and broth. Then remove the hardened fat floating on the surface before serving.   Refrigerate meat drippings and skim off fat to make low-fat gravies.   Serve more fish.   Use less butter, margarine and other high-fat spreads on bread or vegetables.   Use skim or reconstituted non-fat dry milk for cooking.   Cook with low-fat cheeses.   Substitute low-fat yogurt or cottage cheese for all or part of the sour cream in recipes for sauces, dips or congealed salads.   Use half yogurt/half mayonnaise in salad recipes.   Substitute evaporated skim milk for cream. Evaporated skim milk or reconstituted non-fat dry milk can be whipped and substituted for whipped cream in certain recipes.   Choose fresh fruits for dessert instead of high-fat foods such as pies or cakes. Fruits are naturally low in fat.  When Dining Out   Order low-fat appetizers such as fruit or vegetable juice, pasta with vegetables or tomato sauce.   Select clear, rather than cream soups.   Ask that dressings and gravies be served on the side. Then use less of them.   Order foods that are baked, broiled, poached, steamed, stir-fried, or roasted.   Ask for margarine instead of butter, and use only a small amount.   Drink  sparkling water, unsweetened tea or coffee, or diet soft drinks instead of alcohol or other sweet beverages.  QUESTIONS AND ANSWERS ABOUT OTHER FATS IN THE BLOOD:  SATURATED FAT, TRANS FAT, AND CHOLESTEROL What is trans fat? Trans fat is a type of fat that is formed when vegetable oil is hardened through a process called hydrogenation. This process helps makes foods more solid, gives them shape, and prolongs their shelf life. Trans fats are also called hydrogenated or partially hydrogenated oils.  What do saturated fat, trans fat, and cholesterol in foods have to do with heart disease? Saturated fat, trans fat, and cholesterol in the diet all raise the level of LDL "bad" cholesterol in the blood. The higher the LDL cholesterol, the greater the risk for coronary heart disease (CHD). Saturated fat and trans fat raise LDL similarly.  What foods contain saturated fat, trans fat, and cholesterol? High amounts of saturated fat are found in animal products, such as fatty cuts of meat, chicken skin, and full-fat dairy products like butter, whole milk, cream, and cheese, and in tropical vegetable oils such as palm, palm kernel, and coconut oil. Trans fat is found in some of the same foods as saturated fat, such as vegetable shortening, some margarines (especially hard or stick margarine), crackers, cookies, baked goods, fried foods, salad dressings, and other processed foods made with partially hydrogenated vegetable oils. Small amounts of trans fat also occur naturally in some animal products, such as milk products, beef, and lamb. Foods high in cholesterol include liver, other organ meats, egg yolks, shrimp, and full-fat dairy products. How can I use the new food label to make heart-healthy food choices? Check the Nutrition Facts panel of the food label. Choose foods lower in saturated fat, trans fat, and cholesterol. For saturated fat and cholesterol, you can also use the Percent Daily Value (%DV): 5% DV or less  is low, and 20% DV or more is high. (There is no %DV for trans fat.) Use the Nutrition Facts panel to choose foods low in   saturated fat and cholesterol, and if the trans fat is not listed, read the ingredients and limit products that list shortening or hydrogenated or partially hydrogenated vegetable oil, which tend to be high in trans fat. POINTS TO REMEMBER: YOU NEED A LITTLE TLC (THERAPEUTIC LIFESTYLE CHANGES)  Discuss your risk for heart disease with your caregivers, and take steps to reduce risk factors.   Change your diet. Choose foods that are low in saturated fat, trans fat, and cholesterol.   Add exercise to your daily routine if it is not already being done. Participate in physical activity of moderate intensity, like brisk walking, for at least 30 minutes on most, and preferably all days of the week. No time? Break the 30 minutes into three, 10-minute segments during the day.   Stop smoking. If you do smoke, contact your caregiver to discuss ways in which they can help you quit.   Do not use street drugs.   Maintain a normal weight.   Maintain a healthy blood pressure.   Keep up with your blood work for checking the fats in your blood as directed by your caregiver.  Document Released: 07/29/2004 Document Re-Released: 03/31/2010 [employer] Patient Information 2011 [employer]. 

## 2011-04-07 NOTE — Assessment & Plan Note (Signed)
Check FLP today. 

## 2011-04-07 NOTE — Assessment & Plan Note (Signed)
His BP is well controlled, today I will monitor his lytes and renal function 

## 2011-06-16 ENCOUNTER — Other Ambulatory Visit: Payer: Self-pay | Admitting: Internal Medicine

## 2011-06-24 ENCOUNTER — Other Ambulatory Visit: Payer: Self-pay | Admitting: Internal Medicine

## 2011-08-27 ENCOUNTER — Other Ambulatory Visit: Payer: Self-pay | Admitting: Internal Medicine

## 2011-09-09 ENCOUNTER — Encounter: Payer: Self-pay | Admitting: Internal Medicine

## 2011-09-09 ENCOUNTER — Ambulatory Visit (INDEPENDENT_AMBULATORY_CARE_PROVIDER_SITE_OTHER): Payer: 59 | Admitting: Internal Medicine

## 2011-09-09 VITALS — BP 130/88 | HR 70 | Temp 98.4°F | Resp 16 | Ht 67.0 in | Wt 196.5 lb

## 2011-09-09 DIAGNOSIS — K529 Noninfective gastroenteritis and colitis, unspecified: Secondary | ICD-10-CM

## 2011-09-09 DIAGNOSIS — M5126 Other intervertebral disc displacement, lumbar region: Secondary | ICD-10-CM

## 2011-09-09 DIAGNOSIS — K5289 Other specified noninfective gastroenteritis and colitis: Secondary | ICD-10-CM

## 2011-09-09 MED ORDER — METHYLPREDNISOLONE ACETATE 80 MG/ML IJ SUSP
120.0000 mg | Freq: Once | INTRAMUSCULAR | Status: AC
  Administered 2011-09-09: 120 mg via INTRAMUSCULAR

## 2011-09-09 NOTE — Patient Instructions (Signed)
Back Pain, Adult Low back pain is very common. About 1 in 5 people have back pain.The cause of low back pain is rarely dangerous. The pain often gets better over time.About half of people with a sudden onset of back pain feel better in just 2 weeks. About 8 in 10 people feel better by 6 weeks.  CAUSES Some common causes of back pain include:  Strain of the muscles or ligaments supporting the spine.   Wear and tear (degeneration) of the spinal discs.   Arthritis.   Direct injury to the back.  DIAGNOSIS Most of the time, the direct cause of low back pain is not known.However, back pain can be treated effectively even when the exact cause of the pain is unknown.Answering your caregiver's questions about your overall health and symptoms is one of the most accurate ways to make sure the cause of your pain is not dangerous. If your caregiver needs more information, he or she may order lab work or imaging tests (X-rays or MRIs).However, even if imaging tests show changes in your back, this usually does not require surgery. HOME CARE INSTRUCTIONS For many people, back pain returns.Since low back pain is rarely dangerous, it is often a condition that people can learn to manageon their own.   Remain active. It is stressful on the back to sit or stand in one place. Do not sit, drive, or stand in one place for more than 30 minutes at a time. Take short walks on level surfaces as soon as pain allows.Try to increase the length of time you walk each day.   Do not stay in bed.Resting more than 1 or 2 days can delay your recovery.   Do not avoid exercise or work.Your body is made to move.It is not dangerous to be active, even though your back may hurt.Your back will likely heal faster if you return to being active before your pain is gone.   Pay attention to your body when you bend and lift. Many people have less discomfortwhen lifting if they bend their knees, keep the load close to their  bodies,and avoid twisting. Often, the most comfortable positions are those that put less stress on your recovering back.   Find a comfortable position to sleep. Use a firm mattress and lie on your side with your knees slightly bent. If you lie on your back, put a pillow under your knees.   Only take over-the-counter or prescription medicines as directed by your caregiver. Over-the-counter medicines to reduce pain and inflammation are often the most helpful.Your caregiver may prescribe muscle relaxant drugs.These medicines help dull your pain so you can more quickly return to your normal activities and healthy exercise.   Put ice on the injured area.   Put ice in a plastic bag.   Place a towel between your skin and the bag.   Leave the ice on for 15 to 20 minutes, 3 to 4 times a day for the first 2 to 3 days. After that, ice and heat may be alternated to reduce pain and spasms.   Ask your caregiver about trying back exercises and gentle massage. This may be of some benefit.   Avoid feeling anxious or stressed.Stress increases muscle tension and can worsen back pain.It is important to recognize when you are anxious or stressed and learn ways to manage it.Exercise is a great option.  SEEK MEDICAL CARE IF:  You have pain that is not relieved with rest or medicine.   You have   pain that does not improve in 1 week.   You have new symptoms.   You are generally not feeling well.  SEEK IMMEDIATE MEDICAL CARE IF:   You have pain that radiates from your back into your legs.   You develop new bowel or bladder control problems.   You have unusual weakness or numbness in your arms or legs.   You develop nausea or vomiting.   You develop abdominal pain.   You feel faint.  Document Released: 10/11/2005 Document Revised: 06/23/2011 Document Reviewed: 03/01/2011 [facility] Patient Information 2012 [place], [place].Viral Gastroenteritis Viral gastroenteritis is also known as stomach flu. This  condition affects the stomach and intestinal tract. The illness typically lasts 3 to 8 days. Most people develop an immune response. This eventually gets rid of the virus. While this natural response develops, the virus can make you quite ill.  CAUSES  Diarrhea and vomiting are often caused by a virus. Medicines (antibiotics) that kill germs will not help unless there is also a germ (bacterial) infection. SYMPTOMS  The most common symptom is diarrhea. This can cause severe loss of fluids (dehydration) and body salt (electrolyte) imbalance. TREATMENT  Treatments for this illness are aimed at rehydration. Antidiarrheal medicines are not recommended. They do not decrease diarrhea volume and may be harmful. Usually, home treatment is all that is needed. The most serious cases involve vomiting so severely that you are not able to keep down fluids taken by mouth (orally). In these cases, intravenous (IV) fluids are needed. Vomiting with viral gastroenteritis is common, but it will usually go away with treatment. HOME CARE INSTRUCTIONS  Small amounts of fluids should be taken frequently. Large amounts at one time may not be tolerated. Plain water may be harmful in infants and the elderly. Oral rehydration solutions (ORS) are available at pharmacies and grocery stores. ORS replace water and important electrolytes in proper proportions. Sports drinks are not as effective as ORS and may be harmful due to sugars worsening diarrhea.  As a general guideline for children, replace any new fluid losses from diarrhea or vomiting with ORS as follows:   If your child weighs 22 pounds or under (10 kg or less), give 60-120 mL (1/4 - 1/2 cup or 2 - 4 ounces) of ORS for each diarrheal stool or vomiting episode.   If your child weighs more than 22 pounds (more than 10 kgs), give 120-240 mL (1/2 - 1 cup or 4 - 8 ounces) of ORS for each diarrheal stool or vomiting episode.   In a child with vomiting, it may be helpful to  give the above ORS replacement in 5 mL (1 teaspoon) amounts every 5 minutes, then increase as tolerated.   While correcting for dehydration, children should eat normally. However, foods high in sugar should be avoided because this may worsen diarrhea. Large amounts of carbonated soft drinks, juice, gelatin desserts, and other highly sugared drinks should be avoided.   After correction of dehydration, other liquids that are appealing to the child may be added. Children should drink small amounts of fluids frequently and fluids should be increased as tolerated.   Adults should eat normally while drinking more fluids than usual. Drink small amounts of fluids frequently and increase as tolerated. Drink enough water and fluids to keep your urine clear or pale yellow. Broths, weak decaffeinated tea, lemon-lime soft drinks (allowed to go flat), and ORS replace fluids and electrolytes.   Avoid:   Carbonated drinks.   Juice.   Extremely  hot or cold fluids.   Caffeine drinks.   Fatty, greasy foods.   Alcohol.   Tobacco.   Too much intake of anything at one time.   Gelatin desserts.   Probiotics are active cultures of beneficial bacteria. They may lessen the amount and number of diarrheal stools in adults. Probiotics can be found in yogurt with active cultures and in supplements.   Wash your hands well to avoid spreading bacteria and viruses.   Antidiarrheal medicines are not recommended for infants and children.   Only take over-the-counter or prescription medicines for pain, discomfort, or fever as directed by your caregiver. Do not give aspirin to children.   For adults with dehydration, ask your caregiver if you should continue all prescribed and over-the-counter medicines.   If your caregiver has given you a follow-up appointment, it is very important to keep that appointment. Not keeping the appointment could result in a lasting (chronic) or permanent injury and disability. If there  is any problem keeping the appointment, you must call to reschedule.  SEEK IMMEDIATE MEDICAL CARE IF:   You are unable to keep fluids down.   There is no urine output in 6 to 8 hours or there is only a small amount of very dark urine.   You develop shortness of breath.   There is blood in the vomit (may look like coffee grounds) or stool.   Belly (abdominal) pain develops, increases, or localizes.   There is persistent vomiting or diarrhea.   You have a fever.   Your baby is older than 3 months with a rectal temperature of 102 F (38.9 C) or higher.   Your baby is 21 months old or younger with a rectal temperature of 100.4 F (38 C) or higher.  MAKE SURE YOU:   Understand these instructions.   Will watch your condition.   Will get help right away if you are not doing well or get worse.  Document Released: 10/11/2005 Document Revised: 06/23/2011 Document Reviewed: 02/22/2007 [facility] Patient Information 2012 [place], [place].

## 2011-09-09 NOTE — Assessment & Plan Note (Signed)
He was given an injection of depo-medrol IM to reduce the pain and other symptoms

## 2011-09-09 NOTE — Assessment & Plan Note (Signed)
By his description this has resolved

## 2011-09-09 NOTE — Progress Notes (Signed)
Subjective:    Patient ID: Bradley Conner, male    DOB: 1956/03/03, 55 y.o.   MRN: [id number]  Back Pain This is a recurrent problem. The current episode started in the past 7 days. The problem occurs intermittently. The problem is unchanged. The pain is present in the lumbar spine. The quality of the pain is described as burning. The pain radiates to the left thigh. The pain is at a severity of 4/10. The pain is moderate. The pain is worse during the day. The symptoms are aggravated by bending. Pertinent negatives include no abdominal pain, bladder incontinence, bowel incontinence, chest pain, dysuria, fever, headaches, leg pain, numbness, paresis, paresthesias, pelvic pain, perianal numbness, tingling, weakness or weight loss. He has tried nothing for the symptoms.  Emesis  This is a new problem. Episode onset: 2 days ago. The problem occurs intermittently. The problem has been gradually improving. The emesis has an appearance of stomach contents. There has been no fever. Pertinent negatives include no abdominal pain, arthralgias, chest pain, chills, coughing, diarrhea, dizziness, fever, headaches, myalgias, sweats, URI or weight loss. Risk factors include suspect food intake (N/V started after he ate cactus salad at a [place] [employer]). He has tried nothing for the symptoms.      Review of Systems  Constitutional: Negative.  Negative for fever, chills and weight loss.  HENT: Negative.   Eyes: Negative.   Respiratory: Negative for cough, shortness of breath, wheezing and stridor.   Cardiovascular: Negative for chest pain, palpitations and leg swelling.  Gastrointestinal: Positive for nausea and vomiting. Negative for abdominal pain, diarrhea, constipation, blood in stool, abdominal distention, anal bleeding, rectal pain and bowel incontinence.  Genitourinary: Negative for bladder incontinence, dysuria, urgency, frequency, flank pain, decreased urine volume, enuresis, difficulty urinating and pelvic  pain.  Musculoskeletal: Positive for back pain. Negative for myalgias, joint swelling, arthralgias and gait problem.  Skin: Negative for color change, pallor, rash and wound.  Neurological: Negative for dizziness, tingling, tremors, seizures, syncope, facial asymmetry, speech difficulty, weakness, light-headedness, numbness, headaches and paresthesias.  Hematological: Negative for adenopathy. Does not bruise/bleed easily.  Psychiatric/Behavioral: Negative.        Objective:   Physical Exam  Vitals reviewed. Constitutional: He is oriented to person, place, and time. He appears well-developed and well-nourished. No distress.  HENT:  Head: Normocephalic and atraumatic.  Mouth/Throat: Oropharynx is clear and moist. No oropharyngeal exudate.  Eyes: Conjunctivae are normal. Right eye exhibits no discharge. Left eye exhibits no discharge. No scleral icterus.  Neck: Normal range of motion. Neck supple. No JVD present. No tracheal deviation present. No thyromegaly present.  Cardiovascular: Normal rate, regular rhythm, normal heart sounds and intact distal pulses.  Exam reveals no gallop and no friction rub.   No murmur heard. Pulmonary/Chest: Effort normal and breath sounds normal. No stridor. No respiratory distress. He has no wheezes. He has no rales. He exhibits no tenderness.  Abdominal: Soft. Bowel sounds are normal. He exhibits no distension and no mass. There is no tenderness. There is no rebound and no guarding.  Musculoskeletal: Normal range of motion. He exhibits no edema and no tenderness.       Lumbar back: Normal. He exhibits normal range of motion, no tenderness, no bony tenderness, no swelling, no edema, no deformity, no laceration, no pain, no spasm and normal pulse.       + SLR in left leg - SLR in right leg  Lymphadenopathy:    He has no cervical adenopathy.  Neurological: He  is oriented to person, place, and time.  Skin: Skin is warm and dry. No rash noted. He is not  diaphoretic. No erythema. No pallor.  Psychiatric: He has a normal mood and affect. His behavior is normal. Judgment and thought content normal.      Lab Results  Component Value Date   WBC 9.4 05/22/2010   HGB 17.3* 12/03/2010   HCT 47.2 05/22/2010   PLT 220.0 05/22/2010   GLUCOSE 110* 04/07/2011   CHOL 152 04/07/2011   TRIG 177.0* 04/07/2011   HDL 22.70* 04/07/2011   LDLCALC 94 04/07/2011   ALT 29 04/07/2011   AST 23 04/07/2011   NA 140 04/07/2011   K 4.2 04/07/2011   CL 106 04/07/2011   CREATININE 1.0 04/07/2011   BUN 15 04/07/2011   CO2 28 04/07/2011   TSH 1.23 04/07/2011   PSA 0.77 05/22/2010      Assessment & Plan:

## 2012-03-08 ENCOUNTER — Ambulatory Visit (INDEPENDENT_AMBULATORY_CARE_PROVIDER_SITE_OTHER): Payer: 59 | Admitting: Internal Medicine

## 2012-03-08 ENCOUNTER — Encounter: Payer: Self-pay | Admitting: Internal Medicine

## 2012-03-08 VITALS — BP 110/74 | HR 74 | Temp 98.5°F | Ht 67.0 in | Wt 201.0 lb

## 2012-03-08 DIAGNOSIS — I10 Essential (primary) hypertension: Secondary | ICD-10-CM

## 2012-03-08 DIAGNOSIS — J302 Other seasonal allergic rhinitis: Secondary | ICD-10-CM

## 2012-03-08 DIAGNOSIS — J309 Allergic rhinitis, unspecified: Secondary | ICD-10-CM

## 2012-03-08 MED ORDER — METHYLPREDNISOLONE ACETATE 80 MG/ML IJ SUSP
120.0000 mg | Freq: Once | INTRAMUSCULAR | Status: AC
  Administered 2012-03-08: 120 mg via INTRAMUSCULAR

## 2012-03-08 MED ORDER — FLUTICASONE PROPIONATE 50 MCG/ACT NA SUSP: 2.0000 | Freq: Every day | NASAL | Status: AC

## 2012-03-08 NOTE — Progress Notes (Signed)
  Subjective:    HPI  complains of head cold symptoms  Onset >1 week ago, wax/wane symptoms  associated with rhinorrhea, sneezing, sore throat, mild headache  Also myalgias, sinus pressure and mild-mod chest congestion Denies fever No relief with OTC meds - claritin D Precipitated by season change  Past Medical History  Diagnosis Date  . GERD (gastroesophageal reflux disease)   . HTN (hypertension)   . Genital herpes   . Cold sore   . Plantar fasciitis   . Anxiety   . BPH (benign prostatic hypertrophy)     Review of Systems Constitutional: No fever or night sweats, no unexpected weight change Pulmonary: No pleurisy or hemoptysis Cardiovascular: No chest pain or palpitations     Objective:   Physical Exam BP 110/74  Pulse 74  Temp(Src) 98.5 F (36.9 C) (Oral)  Ht 5\' 7"  (1.702 m)  Wt 201 lb (91.173 kg)  BMI 31.48 kg/m2  SpO2 96% GEN: nontoxic appearing and audible head/chest congestion HENT: NCAT, no sinus tenderness bilaterally, nares with clear discharge and mild turb swelling, oropharynx mild erythema, no exudate Eyes: Vision grossly intact, no conjunctivitis Lungs: Clear to auscultation without rhonchi or wheeze, no increased work of breathing Cardiovascular: Regular rate and rhythm, no bilateral edema      Assessment & Plan:  allergic rhinitis, severe symptoms  Cough, postnasal drip related to above   Explained lack of efficacy for antibiotics Steroid shot for inflammation as done in past Start nasal steriod - erx done Symptomatic care with Claritin D, Tylenol or Advil, hydration and rest -  salt gargle and nasal irrigation advised as needed

## 2012-03-08 NOTE — Assessment & Plan Note (Signed)
BP Readings from Last 3 Encounters:  03/08/12 110/74  09/09/11 130/88  04/07/11 122/80   The current medical regimen is effective;  continue present plan and medications.

## 2012-03-08 NOTE — Patient Instructions (Signed)
Allergic Rhinitis Allergic rhinitis is when the mucous membranes in the nose respond to allergens. Allergens are particles in the air that cause your body to have an allergic reaction. This causes you to release allergic antibodies. Through a chain of events, these eventually cause you to release histamine into the blood stream (hence the use of antihistamines). Although meant to be protective to the body, it is this release that causes your discomfort, such as frequent sneezing, congestion and an itchy runny nose.  CAUSES The pollen allergens may come from grasses, trees, and weeds. This is seasonal allergic rhinitis, or "hay fever." Other allergens cause year-round allergic rhinitis (perennial allergic rhinitis) such as house dust mite allergen, pet dander and mold spores.  SYMPTOMS  Nasal stuffiness (congestion).   Runny, itchy nose with sneezing and tearing of the eyes.   There is often an itching of the mouth, eyes and ears.  It cannot be cured, but it can be controlled with medications. DIAGNOSIS If you are unable to determine the offending allergen, skin or blood testing may find it. TREATMENT  Avoid the allergen.   Medications and allergy shots (immunotherapy) can help.   Hay fever may often be treated with antihistamines in pill or nasal spray forms. Antihistamines block the effects of histamine. There are over-the-counter medicines that may help with nasal congestion and swelling around the eyes. Check with your caregiver before taking or giving this medicine.  If the treatment above does not work, there are many new medications your caregiver can prescribe. Stronger medications may be used if initial measures are ineffective. Desensitizing injections can be used if medications and avoidance fails. Desensitization is when a patient is given ongoing shots until the body becomes less sensitive to the allergen. Make sure you follow up with your caregiver if problems continue. SEEK  MEDICAL CARE IF:   You develop fever (more than 100.5F (38.1 C).   You develop a cough that does not stop easily (persistent).   You have shortness of breath.   You start wheezing.   Symptoms interfere with normal daily activities.  Document Released: 07/06/2001 Document Re-Released: 11/02/2009 [employer] Patient Information 2011 [employer]. 

## 2012-06-16 ENCOUNTER — Ambulatory Visit (INDEPENDENT_AMBULATORY_CARE_PROVIDER_SITE_OTHER): Payer: 59 | Admitting: Internal Medicine

## 2012-06-16 ENCOUNTER — Other Ambulatory Visit (INDEPENDENT_AMBULATORY_CARE_PROVIDER_SITE_OTHER): Payer: 59

## 2012-06-16 ENCOUNTER — Encounter: Payer: Self-pay | Admitting: Internal Medicine

## 2012-06-16 VITALS — BP 110/76 | HR 60 | Temp 98.3°F | Resp 16 | Wt 191.0 lb

## 2012-06-16 DIAGNOSIS — Z0000 Encounter for general adult medical examination without abnormal findings: Secondary | ICD-10-CM

## 2012-06-16 DIAGNOSIS — R7309 Other abnormal glucose: Secondary | ICD-10-CM

## 2012-06-16 DIAGNOSIS — I10 Essential (primary) hypertension: Secondary | ICD-10-CM

## 2012-06-16 DIAGNOSIS — R739 Hyperglycemia, unspecified: Secondary | ICD-10-CM | POA: Insufficient documentation

## 2012-06-16 LAB — URINALYSIS, ROUTINE W REFLEX MICROSCOPIC
Ketones, ur: NEGATIVE
Specific Gravity, Urine: 1.025 (ref 1.000–1.030)
Total Protein, Urine: NEGATIVE
Urine Glucose: NEGATIVE
Urobilinogen, UA: 0.2 (ref 0.0–1.0)
pH: 6 (ref 5.0–8.0)

## 2012-06-16 LAB — CBC WITH DIFFERENTIAL/PLATELET
Basophils Absolute: 0 10*3/uL (ref 0.0–0.1)
Eosinophils Relative: 3.4 % (ref 0.0–5.0)
Lymphs Abs: 2 10*3/uL (ref 0.7–4.0)
MCV: 87.3 fl (ref 78.0–100.0)
Monocytes Absolute: 0.6 10*3/uL (ref 0.1–1.0)
Neutrophils Relative %: 58.9 % (ref 43.0–77.0)
Platelets: 202 10*3/uL (ref 150.0–400.0)
RDW: 12.9 % (ref 11.5–14.6)
WBC: 7 10*3/uL (ref 4.5–10.5)

## 2012-06-16 LAB — COMPREHENSIVE METABOLIC PANEL
ALT: 25 U/L (ref 0–53)
Albumin: 4 g/dL (ref 3.5–5.2)
Alkaline Phosphatase: 58 U/L (ref 39–117)
CO2: 28 mEq/L (ref 19–32)
GFR: 77.32 mL/min (ref >60.00)
Glucose, Bld: 90 mg/dL (ref 70–99)
Potassium: 4.3 mEq/L (ref 3.5–5.1)
Sodium: 139 mEq/L (ref 135–145)
Total Protein: 6.2 g/dL (ref 6.0–8.3)

## 2012-06-16 LAB — LIPID PANEL
Cholesterol: 125 mg/dL (ref 0–200)
LDL Cholesterol: 80 mg/dL (ref 0–99)
Triglycerides: 121 mg/dL (ref 0.0–149.0)

## 2012-06-16 LAB — FECAL OCCULT BLOOD, GUAIAC: Fecal Occult Blood: NEGATIVE

## 2012-06-16 LAB — TSH: TSH: 1.22 u[IU]/mL (ref 0.35–5.50)

## 2012-06-16 LAB — PSA: PSA: 0.92 ng/mL (ref 0.10–4.00)

## 2012-06-16 LAB — HEMOGLOBIN A1C: Hgb A1c MFr Bld: 5.2 % (ref 4.6–6.5)

## 2012-06-16 NOTE — Assessment & Plan Note (Signed)
Exam done, labs ordered, pt ed material was given 

## 2012-06-16 NOTE — Progress Notes (Signed)
Subjective:    Patient ID: Bradley Conner, male    DOB: [birth year], 56 y.o.   MRN: [id number]  Hypertension This is a chronic problem. The current episode started more than 1 year ago. The problem has been gradually improving since onset. The problem is controlled. Pertinent negatives include no anxiety, blurred vision, chest pain, headaches, malaise/fatigue, neck pain, orthopnea, palpitations, peripheral edema, PND, shortness of breath or sweats. There are no associated agents to hypertension. Past treatments include ACE inhibitors. The current treatment provides significant improvement. There are no compliance problems.       Review of Systems  Constitutional: Negative.  Negative for malaise/fatigue.  HENT: Negative.  Negative for neck pain.   Eyes: Negative.  Negative for blurred vision.  Respiratory: Negative for cough, chest tightness, shortness of breath, wheezing and stridor.   Cardiovascular: Negative for chest pain, palpitations, orthopnea, leg swelling and PND.  Gastrointestinal: Negative for nausea, vomiting, abdominal pain, diarrhea, constipation, blood in stool and anal bleeding.  Genitourinary: Negative for dysuria, frequency, hematuria, flank pain, penile swelling, enuresis and difficulty urinating.  Musculoskeletal: Negative.   Skin: Negative for color change, pallor and wound.  Neurological: Negative.  Negative for headaches.  Hematological: Negative.   Psychiatric/Behavioral: Negative.        Objective:   Physical Exam  Vitals reviewed. Constitutional: He is oriented to person, place, and time. He appears well-developed and well-nourished. No distress.  HENT:  Head: Normocephalic and atraumatic.  Mouth/Throat: Oropharynx is clear and moist. No oropharyngeal exudate.  Eyes: Conjunctivae are normal. Right eye exhibits no discharge. Left eye exhibits no discharge. No scleral icterus.  Neck: Normal range of motion. Neck supple. No JVD present. No tracheal deviation  present. No thyromegaly present.  Cardiovascular: Normal rate, regular rhythm, normal heart sounds and intact distal pulses.  Exam reveals no gallop and no friction rub.   No murmur heard. Pulmonary/Chest: Effort normal and breath sounds normal. No stridor. No respiratory distress. He has no wheezes. He has no rales. He exhibits no tenderness.  Abdominal: Soft. Bowel sounds are normal. He exhibits no distension and no mass. There is no tenderness. There is no rebound and no guarding. Hernia confirmed negative in the right inguinal area and confirmed negative in the left inguinal area.  Genitourinary: Rectum normal, testes normal and penis normal. Rectal exam shows no external hemorrhoid, no internal hemorrhoid, no fissure, no mass, no tenderness and anal tone normal. Guaiac negative stool. Prostate is enlarged (1+ smooth symmetrical BPH). Prostate is not tender. Right testis shows no mass, no swelling and no tenderness. Right testis is descended. Left testis shows no mass, no swelling and no tenderness. Left testis is descended. Circumcised. No penile erythema or penile tenderness. No discharge found.  Musculoskeletal: Normal range of motion. He exhibits no edema and no tenderness.  Lymphadenopathy:    He has no cervical adenopathy.       Right: No inguinal adenopathy present.       Left: No inguinal adenopathy present.  Neurological: He is oriented to person, place, and time.  Skin: Skin is warm and dry. No rash noted. He is not diaphoretic. No erythema. No pallor.  Psychiatric: He has a normal mood and affect. His behavior is normal. Judgment and thought content normal.      Lab Results  Component Value Date   WBC 9.4 05/22/2010   HGB 17.3* 12/03/2010   HCT 47.2 05/22/2010   PLT 220.0 05/22/2010   GLUCOSE 110* 04/07/2011   CHOL 152  04/07/2011   TRIG 177.0* 04/07/2011   HDL 22.70* 04/07/2011   LDLCALC 94 04/07/2011   ALT 29 04/07/2011   AST 23 04/07/2011   NA 140 04/07/2011   K 4.2 04/07/2011    CL 106 04/07/2011   CREATININE 1.0 04/07/2011   BUN 15 04/07/2011   CO2 28 04/07/2011   TSH 1.23 04/07/2011   PSA 0.77 05/22/2010      Assessment & Plan:

## 2012-06-16 NOTE — Assessment & Plan Note (Signed)
His BP is well controlled, I will check his lytes and renal function 

## 2012-06-16 NOTE — Patient Instructions (Signed)
Health Maintenance, Males A healthy lifestyle and preventative care can promote health and wellness.  Maintain regular health, dental, and eye exams.   Eat a healthy diet. Foods like vegetables, fruits, whole grains, low-fat dairy products, and lean protein foods contain the nutrients you need without too many calories. Decrease your intake of foods high in solid fats, added sugars, and salt. Get information about a proper diet from your caregiver, if necessary.   Regular physical exercise is one of the most important things you can do for your health. Most adults should get at least 150 minutes of moderate-intensity exercise (any activity that increases your heart rate and causes you to sweat) each week. In addition, most adults need muscle-strengthening exercises on 2 or more days a week.    Maintain a healthy weight. The body mass index (BMI) is a screening tool to identify possible weight problems. It provides an estimate of body fat based on height and weight. Your caregiver can help determine your BMI, and can help you achieve or maintain a healthy weight. For adults 20 years and older:   A BMI below 18.5 is considered underweight.   A BMI of 18.5 to 24.9 is normal.   A BMI of 25 to 29.9 is considered overweight.   A BMI of 30 and above is considered obese.   Maintain normal blood lipids and cholesterol by exercising and minimizing your intake of saturated fat. Eat a balanced diet with plenty of fruits and vegetables. Blood tests for lipids and cholesterol should begin at age 20 and be repeated every 5 years. If your lipid or cholesterol levels are high, you are over 50, or you are a high risk for heart disease, you may need your cholesterol levels checked more frequently.Ongoing high lipid and cholesterol levels should be treated with medicines, if diet and exercise are not effective.   If you smoke, find out from your caregiver how to quit. If you do not use tobacco, do not start.    If you choose to drink alcohol, do not exceed 2 drinks per day. One drink is considered to be 12 ounces (355 mL) of beer, 5 ounces (148 mL) of wine, or 1.5 ounces (44 mL) of liquor.   Avoid use of street drugs. Do not share needles with anyone. Ask for help if you need support or instructions about stopping the use of drugs.   High blood pressure causes heart disease and increases the risk of stroke. Blood pressure should be checked at least every 1 to 2 years. Ongoing high blood pressure should be treated with medicines if weight loss and exercise are not effective.   If you are 45 to 54 years old, ask your caregiver if you should take aspirin to prevent heart disease.   Diabetes screening involves taking a blood sample to check your fasting blood sugar level. This should be done once every 3 years, after age 45, if you are within normal weight and without risk factors for diabetes. Testing should be considered at a younger age or be carried out more frequently if you are overweight and have at least 1 risk factor for diabetes.   Colorectal cancer can be detected and often prevented. Most routine colorectal cancer screening begins at the age of 50 and continues through age 75. However, your caregiver may recommend screening at an earlier age if you have risk factors for colon cancer. On a yearly basis, your caregiver may provide home test kits to check for hidden   blood in the stool. Use of a small camera at the end of a tube, to directly examine the colon (sigmoidoscopy or colonoscopy), can detect the earliest forms of colorectal cancer. Talk to your caregiver about this at age 50, when routine screening begins. Direct examination of the colon should be repeated every 5 to 10 years through age 75, unless early forms of pre-cancerous polyps or small growths are found.   Hepatitis C blood testing is recommended for all people born from 1945 through 1965 and any individual with known risks for  hepatitis C.   Healthy men should no longer receive prostate-specific antigen (PSA) blood tests as part of routine cancer screening. Consult with your caregiver about prostate cancer screening.   Testicular cancer screening is not recommended for adolescents or adult males who have no symptoms. Screening includes self-exam, caregiver exam, and other screening tests. Consult with your caregiver about any symptoms you have or any concerns you have about testicular cancer.   Practice safe sex. Use condoms and avoid high-risk sexual practices to reduce the spread of sexually transmitted infections (STIs).   Use sunscreen with a sun protection factor (SPF) of 30 or greater. Apply sunscreen liberally and repeatedly throughout the day. You should seek shade when your shadow is shorter than you. Protect yourself by wearing long sleeves, pants, a wide-brimmed hat, and sunglasses year round, whenever you are outdoors.   Notify your caregiver of new moles or changes in moles, especially if there is a change in shape or color. Also notify your caregiver if a mole is larger than the size of a pencil eraser.   A one-time screening for abdominal aortic aneurysm (AAA) and surgical repair of large AAAs by sound wave imaging (ultrasonography) is recommended for ages 65 to 75 years who are current or former smokers.   Stay current with your immunizations.  Document Released: 04/08/2008 Document Revised: 09/30/2011 Document Reviewed: 03/08/2011 [employer] Patient Information 2012 [employer]. 

## 2012-06-16 NOTE — Assessment & Plan Note (Signed)
I will check his a1c to see if he has developed DM II 

## 2012-07-03 ENCOUNTER — Other Ambulatory Visit: Payer: Self-pay | Admitting: Internal Medicine

## 2012-12-09 ENCOUNTER — Other Ambulatory Visit: Payer: Self-pay | Admitting: Internal Medicine

## 2013-01-29 ENCOUNTER — Encounter: Payer: Self-pay | Admitting: Internal Medicine

## 2013-01-29 ENCOUNTER — Ambulatory Visit (INDEPENDENT_AMBULATORY_CARE_PROVIDER_SITE_OTHER): Payer: 59 | Admitting: Internal Medicine

## 2013-01-29 VITALS — BP 112/70 | HR 58 | Temp 97.7°F | Resp 16 | Wt 203.5 lb

## 2013-01-29 DIAGNOSIS — M5126 Other intervertebral disc displacement, lumbar region: Secondary | ICD-10-CM

## 2013-01-29 DIAGNOSIS — M549 Dorsalgia, unspecified: Secondary | ICD-10-CM

## 2013-01-29 MED ORDER — HYDROCODONE-ACETAMINOPHEN 10-325 MG PO TABS: 1.0000 | ORAL_TABLET | Freq: Three times a day (TID) | ORAL | Status: DC | PRN

## 2013-01-29 MED ORDER — METHYLPREDNISOLONE ACETATE 40 MG/ML IJ SUSP
40.0000 mg | Freq: Once | INTRAMUSCULAR | Status: AC
  Administered 2013-01-29: 40 mg via INTRAMUSCULAR

## 2013-01-29 MED ORDER — METHYLPREDNISOLONE ACETATE 80 MG/ML IJ SUSP
80.0000 mg | Freq: Once | INTRAMUSCULAR | Status: AC
  Administered 2013-01-29: 80 mg via INTRAMUSCULAR

## 2013-01-29 MED ORDER — CELECOXIB 200 MG PO CAPS: 200.0000 mg | ORAL_CAPSULE | Freq: Every day | ORAL | Status: DC

## 2013-01-29 NOTE — Assessment & Plan Note (Signed)
Start celebrex, continue vicodin as needed

## 2013-01-29 NOTE — Progress Notes (Signed)
Subjective:    Patient ID: Bradley Conner, male    DOB: [birth year], 57 y.o.   MRN: [id number]  Back Pain This is a new problem. The current episode started yesterday. The problem occurs constantly. The problem is unchanged. The pain is present in the lumbar spine. The quality of the pain is described as shooting and stabbing. The pain radiates to the left thigh. The pain is at a severity of 4/10. The pain is moderate. The pain is worse during the day. The symptoms are aggravated by standing, position and bending. Associated symptoms include leg pain, numbness (in the left thigh area) and paresthesias (left thigh). Pertinent negatives include no abdominal pain, bladder incontinence, bowel incontinence, chest pain, dysuria, fever, headaches, paresis, pelvic pain, perianal numbness, tingling, weakness or weight loss. Risk factors include obesity and lack of exercise. He has tried analgesics for the symptoms. The treatment provided moderate relief.      Review of Systems  Constitutional: Negative.  Negative for fever, chills, weight loss, diaphoresis, activity change, appetite change, fatigue and unexpected weight change.  HENT: Negative.   Eyes: Negative.   Respiratory: Negative.  Negative for cough, chest tightness, shortness of breath, wheezing and stridor.   Cardiovascular: Negative.  Negative for chest pain, palpitations and leg swelling.  Gastrointestinal: Negative.  Negative for nausea, vomiting, abdominal pain, diarrhea, constipation and bowel incontinence.  Endocrine: Negative.   Genitourinary: Negative.  Negative for bladder incontinence, dysuria and pelvic pain.  Musculoskeletal: Positive for back pain. Negative for myalgias, joint swelling, arthralgias and gait problem.  Skin: Negative.   Allergic/Immunologic: Negative.   Neurological: Positive for numbness (in the left thigh area) and paresthesias (left thigh). Negative for tingling, weakness and headaches.  Hematological: Negative.   Negative for adenopathy. Does not bruise/bleed easily.  Psychiatric/Behavioral: Negative.        Objective:   Physical Exam  Vitals reviewed. Constitutional: He appears well-developed and well-nourished. No distress.  HENT:  Head: Normocephalic and atraumatic.  Mouth/Throat: Oropharynx is clear and moist. No oropharyngeal exudate.  Eyes: Conjunctivae are normal. Right eye exhibits no discharge. Left eye exhibits no discharge. No scleral icterus.  Neck: Normal range of motion. Neck supple. No JVD present. No tracheal deviation present. No thyromegaly present.  Cardiovascular: Normal rate, regular rhythm, normal heart sounds and intact distal pulses.  Exam reveals no gallop and no friction rub.   No murmur heard. Pulmonary/Chest: Effort normal and breath sounds normal. No stridor. No respiratory distress. He has no wheezes. He has no rales. He exhibits no tenderness.  Abdominal: Soft. Bowel sounds are normal. He exhibits no distension and no mass. There is no tenderness. There is no rebound and no guarding.  Musculoskeletal: Normal range of motion. He exhibits no edema and no tenderness.       Lumbar back: Normal. He exhibits normal range of motion, no tenderness, no bony tenderness, no swelling, no edema, no deformity, no laceration, no pain, no spasm and normal pulse.  Lymphadenopathy:    He has no cervical adenopathy.  Neurological: He is alert. He has normal strength. He displays no atrophy, no tremor and normal reflexes. No cranial nerve deficit or sensory deficit. He exhibits normal muscle tone. He displays a negative Romberg sign. He displays no seizure activity. Coordination and gait normal. He displays no Babinski's sign on the right side. He displays no Babinski's sign on the left side.  Reflex Scores:      Tricep reflexes are 1+ on the right side and 1+  on the left side.      Bicep reflexes are 1+ on the right side and 1+ on the left side.      Brachioradialis reflexes are 1+ on  the right side and 1+ on the left side.      Patellar reflexes are 1+ on the right side and 1+ on the left side.      Achilles reflexes are 1+ on the right side and 1+ on the left side. Neg. SLR in BLE  Skin: Skin is warm and dry. No rash noted. He is not diaphoretic. No erythema. No pallor.  Psychiatric: He has a normal mood and affect. His behavior is normal. Judgment and thought content normal.     Lab Results  Component Value Date   WBC 7.0 06/16/2012   HGB 16.2 06/16/2012   HCT 48.2 06/16/2012   PLT 202.0 06/16/2012   GLUCOSE 90 06/16/2012   CHOL 125 06/16/2012   TRIG 121.0 06/16/2012   HDL 20.60* 06/16/2012   LDLCALC 80 06/16/2012   ALT 25 06/16/2012   AST 19 06/16/2012   NA 139 06/16/2012   K 4.3 06/16/2012   CL 105 06/16/2012   CREATININE 1.1 06/16/2012   BUN 16 06/16/2012   CO2 28 06/16/2012   TSH 1.22 06/16/2012   PSA 0.92 06/16/2012   HGBA1C 5.2 06/16/2012       Assessment & Plan:

## 2013-01-29 NOTE — Assessment & Plan Note (Signed)
I have given him a dose of depo-medrol to reduce the pain and inflammation I have ordered an MRI to see if he has spinal stenosis, HNP, nerve impingement, tumor He will try vicodin and celebrex for pain relief

## 2013-01-29 NOTE — Patient Instructions (Signed)
Back Pain, Adult Low back pain is very common. About 1 in 5 people have back pain.The cause of low back pain is rarely dangerous. The pain often gets better over time.About half of people with a sudden onset of back pain feel better in just 2 weeks. About 8 in 10 people feel better by 6 weeks.  CAUSES Some common causes of back pain include:  Strain of the muscles or ligaments supporting the spine.  Wear and tear (degeneration) of the spinal discs.  Arthritis.  Direct injury to the back. DIAGNOSIS Most of the time, the direct cause of low back pain is not known.However, back pain can be treated effectively even when the exact cause of the pain is unknown.Answering your caregiver's questions about your overall health and symptoms is one of the most accurate ways to make sure the cause of your pain is not dangerous. If your caregiver needs more information, he or she may order lab work or imaging tests (X-rays or MRIs).However, even if imaging tests show changes in your back, this usually does not require surgery. HOME CARE INSTRUCTIONS For many people, back pain returns.Since low back pain is rarely dangerous, it is often a condition that people can learn to manageon their own.   Remain active. It is stressful on the back to sit or stand in one place. Do not sit, drive, or stand in one place for more than 30 minutes at a time. Take short walks on level surfaces as soon as pain allows.Try to increase the length of time you walk each day.  Do not stay in bed.Resting more than 1 or 2 days can delay your recovery.  Do not avoid exercise or work.Your body is made to move.It is not dangerous to be active, even though your back may hurt.Your back will likely heal faster if you return to being active before your pain is gone.  Pay attention to your body when you bend and lift. Many people have less discomfortwhen lifting if they bend their knees, keep the load close to their bodies,and  avoid twisting. Often, the most comfortable positions are those that put less stress on your recovering back.  Find a comfortable position to sleep. Use a firm mattress and lie on your side with your knees slightly bent. If you lie on your back, put a pillow under your knees.  Only take over-the-counter or prescription medicines as directed by your caregiver. Over-the-counter medicines to reduce pain and inflammation are often the most helpful.Your caregiver may prescribe muscle relaxant drugs.These medicines help dull your pain so you can more quickly return to your normal activities and healthy exercise.  Put ice on the injured area.  Put ice in a plastic bag.  Place a towel between your skin and the bag.  Leave the ice on for 15 to 20 minutes, 3 to 4 times a day for the first 2 to 3 days. After that, ice and heat may be alternated to reduce pain and spasms.  Ask your caregiver about trying back exercises and gentle massage. This may be of some benefit.  Avoid feeling anxious or stressed.Stress increases muscle tension and can worsen back pain.It is important to recognize when you are anxious or stressed and learn ways to manage it.Exercise is a great option. SEEK MEDICAL CARE IF:  You have pain that is not relieved with rest or medicine.  You have pain that does not improve in 1 week.  You have new symptoms.  You are generally   not feeling well. SEEK IMMEDIATE MEDICAL CARE IF:   You have pain that radiates from your back into your legs.  You develop new bowel or bladder control problems.  You have unusual weakness or numbness in your arms or legs.  You develop nausea or vomiting.  You develop abdominal pain.  You feel faint. Document Released: 10/11/2005 Document Revised: 04/11/2012 Document Reviewed: 03/01/2011 [employer] Patient Information 2013 [employer].  

## 2013-02-05 ENCOUNTER — Other Ambulatory Visit: Payer: Self-pay | Admitting: Internal Medicine

## 2013-02-05 DIAGNOSIS — M549 Dorsalgia, unspecified: Secondary | ICD-10-CM

## 2013-02-05 DIAGNOSIS — M5126 Other intervertebral disc displacement, lumbar region: Secondary | ICD-10-CM

## 2013-02-12 ENCOUNTER — Ambulatory Visit
Admission: RE | Admit: 2013-02-12 | Discharge: 2013-02-12 | Disposition: A | Discharge status: Home / Self Care | Payer: 59 | Source: Ambulatory Visit | Attending: Internal Medicine | Admitting: Internal Medicine

## 2013-02-12 ENCOUNTER — Encounter: Payer: Self-pay | Admitting: Internal Medicine

## 2013-02-12 DIAGNOSIS — M549 Dorsalgia, unspecified: Secondary | ICD-10-CM

## 2013-02-12 DIAGNOSIS — M5126 Other intervertebral disc displacement, lumbar region: Secondary | ICD-10-CM

## 2013-07-03 ENCOUNTER — Ambulatory Visit (INDEPENDENT_AMBULATORY_CARE_PROVIDER_SITE_OTHER): Payer: 59 | Admitting: Internal Medicine

## 2013-07-03 ENCOUNTER — Encounter: Payer: Self-pay | Admitting: Internal Medicine

## 2013-07-03 ENCOUNTER — Other Ambulatory Visit (INDEPENDENT_AMBULATORY_CARE_PROVIDER_SITE_OTHER): Payer: 59

## 2013-07-03 VITALS — BP 118/80 | HR 50 | Temp 98.3°F | Resp 16 | Ht 67.0 in | Wt 202.0 lb

## 2013-07-03 DIAGNOSIS — Z0000 Encounter for general adult medical examination without abnormal findings: Secondary | ICD-10-CM

## 2013-07-03 DIAGNOSIS — M5126 Other intervertebral disc displacement, lumbar region: Secondary | ICD-10-CM

## 2013-07-03 DIAGNOSIS — I10 Essential (primary) hypertension: Secondary | ICD-10-CM

## 2013-07-03 DIAGNOSIS — M5106 Intervertebral disc disorders with myelopathy, lumbar region: Secondary | ICD-10-CM

## 2013-07-03 LAB — URINALYSIS, ROUTINE W REFLEX MICROSCOPIC
Leukocytes, UA: NEGATIVE
Nitrite: NEGATIVE
Specific Gravity, Urine: <=1.005 (ref 1.000–1.030)
Urobilinogen, UA: 0.2 (ref 0.0–1.0)
WBC, UA: NONE SEEN (ref 0–?)
pH: 6 (ref 5.0–8.0)

## 2013-07-03 LAB — HEPATITIS C ANTIBODY: HCV Ab: NEGATIVE

## 2013-07-03 LAB — CBC WITH DIFFERENTIAL/PLATELET
Basophils Relative: 0.5 % (ref 0.0–3.0)
Eosinophils Relative: 2.7 % (ref 0.0–5.0)
HCT: 46.3 % (ref 39.0–52.0)
Lymphs Abs: 2 10*3/uL (ref 0.7–4.0)
MCHC: 34.9 g/dL (ref 30.0–36.0)
MCV: 85.2 fl (ref 78.0–100.0)
Monocytes Absolute: 0.7 10*3/uL (ref 0.1–1.0)
Platelets: 204 10*3/uL (ref 150.0–400.0)
WBC: 7.2 10*3/uL (ref 4.5–10.5)

## 2013-07-03 LAB — LIPID PANEL: Cholesterol: 140 mg/dL (ref 0–200)

## 2013-07-03 LAB — COMPREHENSIVE METABOLIC PANEL
AST: 23 U/L (ref 0–37)
Alkaline Phosphatase: 56 U/L (ref 39–117)
Glucose, Bld: 92 mg/dL (ref 70–99)
Sodium: 140 mEq/L (ref 135–145)
Total Bilirubin: 0.8 mg/dL (ref 0.3–1.2)
Total Protein: 6.5 g/dL (ref 6.0–8.3)

## 2013-07-03 LAB — PSA: PSA: 1.17 ng/mL (ref 0.10–4.00)

## 2013-07-03 LAB — TSH: TSH: 1.74 u[IU]/mL (ref 0.35–5.50)

## 2013-07-03 NOTE — Assessment & Plan Note (Signed)
His BP is well controlled I will check his lytes and renal function today 

## 2013-07-03 NOTE — Assessment & Plan Note (Signed)
Exam done He declined a flu vax Labs ordered Pt ed material was given 

## 2013-07-03 NOTE — Progress Notes (Signed)
Subjective:    Patient ID: Bradley Conner, male    DOB: 17-Mar-1956, 57 y.o.   MRN: [id number]  Hypertension This is a chronic problem. The current episode started more than 1 year ago. The problem is unchanged. The problem is controlled. Pertinent negatives include no anxiety, blurred vision, chest pain, headaches, malaise/fatigue, neck pain, orthopnea, palpitations, peripheral edema, PND, shortness of breath or sweats. Past treatments include ACE inhibitors. The current treatment provides moderate improvement. Compliance problems include exercise and diet.       Review of Systems  Constitutional: Negative.  Negative for malaise/fatigue.  HENT: Negative.  Negative for neck pain.   Eyes: Negative.  Negative for blurred vision.  Respiratory: Negative.  Negative for apnea, cough, choking, chest tightness, shortness of breath, wheezing and stridor.   Cardiovascular: Negative.  Negative for chest pain, palpitations, orthopnea, leg swelling and PND.  Gastrointestinal: Negative.  Negative for nausea, abdominal pain, diarrhea, constipation, blood in stool and abdominal distention (chronic, unchanged).  Endocrine: Negative.   Genitourinary: Negative.   Musculoskeletal: Positive for back pain (chronic,unchanged). Negative for myalgias, joint swelling, arthralgias and gait problem.  Skin: Negative.   Allergic/Immunologic: Negative.   Neurological: Negative.  Negative for headaches.  Hematological: Negative.  Negative for adenopathy. Does not bruise/bleed easily.  Psychiatric/Behavioral: Negative.        Objective:   Physical Exam  Vitals reviewed. Constitutional: He is oriented to person, place, and time. He appears well-developed and well-nourished. No distress.  HENT:  Head: Normocephalic and atraumatic.  Mouth/Throat: Oropharynx is clear and moist. No oropharyngeal exudate.  Eyes: Conjunctivae are normal. Right eye exhibits no discharge. Left eye exhibits no discharge. No scleral icterus.   Neck: Normal range of motion. Neck supple. No JVD present. No tracheal deviation present. No thyromegaly present.  Cardiovascular: Normal rate, regular rhythm, normal heart sounds and intact distal pulses.  Exam reveals no gallop and no friction rub.   No murmur heard. Pulmonary/Chest: Effort normal and breath sounds normal. No stridor. No respiratory distress. He has no wheezes. He has no rales. He exhibits no tenderness.  Abdominal: Soft. Bowel sounds are normal. He exhibits no distension and no mass. There is no tenderness. There is no rebound and no guarding. Hernia confirmed negative in the right inguinal area and confirmed negative in the left inguinal area.  Genitourinary: Rectum normal, prostate normal, testes normal and penis normal. Rectal exam shows no external hemorrhoid, no internal hemorrhoid, no fissure, no mass, no tenderness and anal tone normal. Guaiac negative stool. Prostate is not enlarged and not tender. Right testis shows no mass, no swelling and no tenderness. Right testis is descended. Left testis shows no mass, no swelling and no tenderness. Left testis is descended. Circumcised. No phimosis, paraphimosis, hypospadias, penile erythema or penile tenderness. No discharge found.  Musculoskeletal: Normal range of motion. He exhibits no edema and no tenderness.  Lymphadenopathy:    He has no cervical adenopathy.       Right: No inguinal adenopathy present.       Left: No inguinal adenopathy present.  Neurological: He is oriented to person, place, and time.  Skin: Skin is warm and dry. No rash noted. He is not diaphoretic. No erythema. No pallor.  Psychiatric: He has a normal mood and affect. His behavior is normal. Judgment and thought content normal.     Lab Results  Component Value Date   WBC 7.0 06/16/2012   HGB 16.2 06/16/2012   HCT 48.2 06/16/2012   PLT 202.0  06/16/2012   GLUCOSE 90 06/16/2012   CHOL 125 06/16/2012   TRIG 121.0 06/16/2012   HDL 20.60* 06/16/2012    LDLCALC 80 06/16/2012   ALT 25 06/16/2012   AST 19 06/16/2012   NA 139 06/16/2012   K 4.3 06/16/2012   CL 105 06/16/2012   CREATININE 1.1 06/16/2012   BUN 16 06/16/2012   CO2 28 06/16/2012   TSH 1.22 06/16/2012   PSA 0.92 06/16/2012   HGBA1C 5.2 06/16/2012       Assessment & Plan:

## 2013-07-03 NOTE — Assessment & Plan Note (Signed)
He will continue the current meds for pain He does not want to pursue any additional treatment options at this time

## 2013-07-03 NOTE — Patient Instructions (Signed)
Health Maintenance, Males A healthy lifestyle and preventative care can promote health and wellness.  Maintain regular health, dental, and eye exams.  Eat a healthy diet. Foods like vegetables, fruits, whole grains, low-fat dairy products, and lean protein foods contain the nutrients you need without too many calories. Decrease your intake of foods high in solid fats, added sugars, and salt. Get information about a proper diet from your caregiver, if necessary.  Regular physical exercise is one of the most important things you can do for your health. Most adults should get at least 150 minutes of moderate-intensity exercise (any activity that increases your heart rate and causes you to sweat) each week. In addition, most adults need muscle-strengthening exercises on 2 or more days a week.   Maintain a healthy weight. The body mass index (BMI) is a screening tool to identify possible weight problems. It provides an estimate of body fat based on height and weight. Your caregiver can help determine your BMI, and can help you achieve or maintain a healthy weight. For adults 20 years and older:  A BMI below 18.5 is considered underweight.  A BMI of 18.5 to 24.9 is normal.  A BMI of 25 to 29.9 is considered overweight.  A BMI of 30 and above is considered obese.  Maintain normal blood lipids and cholesterol by exercising and minimizing your intake of saturated fat. Eat a balanced diet with plenty of fruits and vegetables. Blood tests for lipids and cholesterol should begin at age 20 and be repeated every 5 years. If your lipid or cholesterol levels are high, you are over 50, or you are a high risk for heart disease, you may need your cholesterol levels checked more frequently.Ongoing high lipid and cholesterol levels should be treated with medicines, if diet and exercise are not effective.  If you smoke, find out from your caregiver how to quit. If you do not use tobacco, do not start.  If you  choose to drink alcohol, do not exceed 2 drinks per day. One drink is considered to be 12 ounces (355 mL) of beer, 5 ounces (148 mL) of wine, or 1.5 ounces (44 mL) of liquor.  Avoid use of street drugs. Do not share needles with anyone. Ask for help if you need support or instructions about stopping the use of drugs.  High blood pressure causes heart disease and increases the risk of stroke. Blood pressure should be checked at least every 1 to 2 years. Ongoing high blood pressure should be treated with medicines if weight loss and exercise are not effective.  If you are 45 to 55 years old, ask your caregiver if you should take aspirin to prevent heart disease.  Diabetes screening involves taking a blood sample to check your fasting blood sugar level. This should be done once every 3 years, after age 45, if you are within normal weight and without risk factors for diabetes. Testing should be considered at a younger age or be carried out more frequently if you are overweight and have at least 1 risk factor for diabetes.  Colorectal cancer can be detected and often prevented. Most routine colorectal cancer screening begins at the age of 50 and continues through age 75. However, your caregiver may recommend screening at an earlier age if you have risk factors for colon cancer. On a yearly basis, your caregiver may provide home test kits to check for hidden blood in the stool. Use of a small camera at the end of a tube,   to directly examine the colon (sigmoidoscopy or colonoscopy), can detect the earliest forms of colorectal cancer. Talk to your caregiver about this at age 50, when routine screening begins. Direct examination of the colon should be repeated every 5 to 10 years through age 75, unless early forms of pre-cancerous polyps or small growths are found.  Hepatitis C blood testing is recommended for all people born from 1945 through 1965 and any individual with known risks for hepatitis C.  Healthy  men should no longer receive prostate-specific antigen (PSA) blood tests as part of routine cancer screening. Consult with your caregiver about prostate cancer screening.  Testicular cancer screening is not recommended for adolescents or adult males who have no symptoms. Screening includes self-exam, caregiver exam, and other screening tests. Consult with your caregiver about any symptoms you have or any concerns you have about testicular cancer.  Practice safe sex. Use condoms and avoid high-risk sexual practices to reduce the spread of sexually transmitted infections (STIs).  Use sunscreen with a sun protection factor (SPF) of 30 or greater. Apply sunscreen liberally and repeatedly throughout the day. You should seek shade when your shadow is shorter than you. Protect yourself by wearing long sleeves, pants, a wide-brimmed hat, and sunglasses year round, whenever you are outdoors.  Notify your caregiver of new moles or changes in moles, especially if there is a change in shape or color. Also notify your caregiver if a mole is larger than the size of a pencil eraser.  A one-time screening for abdominal aortic aneurysm (AAA) and surgical repair of large AAAs by sound wave imaging (ultrasonography) is recommended for ages 65 to 75 years who are current or former smokers.  Stay current with your immunizations. Document Released: 04/08/2008 Document Revised: 01/03/2012 Document Reviewed: 03/08/2011 [employer] Patient Information 2014 [employer].  

## 2013-07-04 ENCOUNTER — Encounter: Payer: Self-pay | Admitting: Internal Medicine

## 2013-07-04 ENCOUNTER — Other Ambulatory Visit: Payer: Self-pay | Admitting: Internal Medicine

## 2013-07-05 ENCOUNTER — Ambulatory Visit (INDEPENDENT_AMBULATORY_CARE_PROVIDER_SITE_OTHER): Payer: 59 | Admitting: Internal Medicine

## 2013-07-05 VITALS — BP 132/86 | HR 59 | Temp 98.3°F | Resp 16 | Wt 200.0 lb

## 2013-07-05 DIAGNOSIS — M5126 Other intervertebral disc displacement, lumbar region: Secondary | ICD-10-CM

## 2013-07-05 DIAGNOSIS — M5106 Intervertebral disc disorders with myelopathy, lumbar region: Secondary | ICD-10-CM

## 2013-07-05 MED ORDER — METHYLPREDNISOLONE ACETATE 80 MG/ML IJ SUSP
120.0000 mg | Freq: Once | INTRAMUSCULAR | Status: AC
  Administered 2013-07-05: 120 mg via INTRAMUSCULAR

## 2013-07-05 MED ORDER — CYCLOBENZAPRINE HCL 10 MG PO TABS: 10.0000 mg | ORAL_TABLET | Freq: Three times a day (TID) | ORAL | Status: DC | PRN

## 2013-07-05 MED ORDER — NAPROXEN 500 MG PO TABS: 500.0000 mg | ORAL_TABLET | Freq: Two times a day (BID) | ORAL | Status: DC

## 2013-07-05 NOTE — Patient Instructions (Signed)

## 2013-07-06 ENCOUNTER — Encounter: Payer: Self-pay | Admitting: Internal Medicine

## 2013-07-06 NOTE — Assessment & Plan Note (Signed)
He has an acute herniation but there is no evidence of radiculopathy on the exam I gave him an injection of depo-medrol IM to reduce the and inflammation I will upgrade his meds as well for better pain relief

## 2013-07-06 NOTE — Progress Notes (Signed)
Subjective:    Patient ID: Bradley Conner, male    DOB: [birth year], 57 y.o.   MRN: [id number]  Back Pain This is a new problem. Episode onset: for 2 days. The problem occurs constantly. The problem is unchanged. The pain is present in the lumbar spine. The quality of the pain is described as stabbing and shooting. The pain radiates to the right thigh. The pain is at a severity of 4/10. The pain is moderate. The pain is worse during the day. The symptoms are aggravated by position and standing. Pertinent negatives include no abdominal pain, bladder incontinence, bowel incontinence, chest pain, dysuria, fever, headaches, leg pain, numbness, paresis, paresthesias, pelvic pain, perianal numbness, tingling, weakness or weight loss. He has tried NSAIDs for the symptoms. The treatment provided mild relief.      Review of Systems  Constitutional: Negative.  Negative for fever, chills, weight loss, diaphoresis, appetite change and fatigue.  HENT: Negative.   Eyes: Negative.   Respiratory: Negative.  Negative for cough, choking, chest tightness, shortness of breath, wheezing and stridor.   Cardiovascular: Negative.  Negative for chest pain, palpitations and leg swelling.  Gastrointestinal: Negative.  Negative for abdominal pain, diarrhea, constipation, blood in stool and bowel incontinence.  Endocrine: Negative.   Genitourinary: Negative.  Negative for bladder incontinence, dysuria and pelvic pain.  Musculoskeletal: Positive for back pain. Negative for myalgias, joint swelling, arthralgias and gait problem.  Skin: Negative.   Allergic/Immunologic: Negative.   Neurological: Negative.  Negative for dizziness, tingling, tremors, weakness, light-headedness, numbness, headaches and paresthesias.  Hematological: Negative.  Negative for adenopathy. Does not bruise/bleed easily.  Psychiatric/Behavioral: Negative.        Objective:   Physical Exam  Vitals reviewed. Constitutional: He is oriented to  person, place, and time. He appears well-developed and well-nourished. No distress.  HENT:  Head: Normocephalic and atraumatic.  Mouth/Throat: Oropharynx is clear and moist. No oropharyngeal exudate.  Eyes: Conjunctivae are normal. Right eye exhibits no discharge. Left eye exhibits no discharge. No scleral icterus.  Neck: Normal range of motion. Neck supple. No JVD present. No tracheal deviation present. No thyromegaly present.  Cardiovascular: Normal rate, regular rhythm, normal heart sounds and intact distal pulses.  Exam reveals no gallop and no friction rub.   No murmur heard. Pulmonary/Chest: Effort normal and breath sounds normal. No stridor. No respiratory distress. He has no wheezes. He has no rales. He exhibits no tenderness.  Abdominal: Soft. Bowel sounds are normal. He exhibits no distension and no mass. There is no tenderness. There is no rebound and no guarding.  Musculoskeletal: Normal range of motion. He exhibits no edema and no tenderness.       Lumbar back: Normal. He exhibits normal range of motion, no tenderness, no bony tenderness, no swelling, no edema, no deformity, no pain, no spasm and normal pulse.  Lymphadenopathy:    He has no cervical adenopathy.  Neurological: He is alert and oriented to person, place, and time. He has normal strength. He displays no atrophy, no tremor and normal reflexes. No cranial nerve deficit or sensory deficit. He exhibits normal muscle tone. He displays a negative Romberg sign. He displays no seizure activity. Coordination and gait normal. He displays no Babinski's sign on the right side. He displays no Babinski's sign on the left side.  Reflex Scores:      Tricep reflexes are 1+ on the right side and 1+ on the left side.      Bicep reflexes are 1+ on the  right side and 1+ on the left side.      Brachioradialis reflexes are 1+ on the right side and 1+ on the left side.      Patellar reflexes are 1+ on the right side and 1+ on the left side.       Achilles reflexes are 1+ on the right side and 1+ on the left side. Neg SLR in BLE  Skin: Skin is warm and dry. No rash noted. He is not diaphoretic. No erythema. No pallor.          Assessment & Plan:

## 2013-07-13 ENCOUNTER — Telehealth: Payer: Self-pay | Admitting: Internal Medicine

## 2013-07-13 DIAGNOSIS — M5106 Intervertebral disc disorders with myelopathy, lumbar region: Secondary | ICD-10-CM

## 2013-07-13 NOTE — Telephone Encounter (Signed)
done

## 2013-07-13 NOTE — Telephone Encounter (Signed)
Pt request referral for spine specialist or nueronogist due to his back pain and foot falls asleep. Please advise.

## 2013-07-14 ENCOUNTER — Other Ambulatory Visit: Payer: Self-pay | Admitting: Internal Medicine

## 2013-07-19 ENCOUNTER — Telehealth: Payer: Self-pay | Admitting: Internal Medicine

## 2013-07-19 NOTE — Telephone Encounter (Signed)
Pt's back is better but he still wants to see a spine specialist.  He is aware it will be next week before the referral is sent.

## 2013-07-30 ENCOUNTER — Telehealth: Payer: Self-pay | Admitting: *Deleted

## 2013-07-30 NOTE — Telephone Encounter (Signed)
I need the doctor's name and specialty

## 2013-07-30 NOTE — Telephone Encounter (Signed)
Spoke with pt, he is to call back tomorrow with requested information.

## 2013-07-30 NOTE — Telephone Encounter (Signed)
Pt called requesting referral to Back Specialist at [facility].  Please advise

## 2013-09-26 ENCOUNTER — Other Ambulatory Visit: Payer: Self-pay | Admitting: Internal Medicine

## 2013-10-15 ENCOUNTER — Telehealth: Payer: Self-pay | Admitting: Internal Medicine

## 2013-10-15 MED ORDER — OMEPRAZOLE 20 MG PO CPDR: DELAYED_RELEASE_CAPSULE | ORAL | Status: DC

## 2013-10-15 NOTE — Telephone Encounter (Signed)
RX refilled per pt request.

## 2013-10-15 NOTE — Telephone Encounter (Signed)
Pt request refill for Prilosec. Please advise.

## 2013-12-02 ENCOUNTER — Emergency Department (HOSPITAL_COMMUNITY): Payer: 59

## 2013-12-02 ENCOUNTER — Encounter (HOSPITAL_COMMUNITY): Payer: Self-pay | Admitting: Emergency Medicine

## 2013-12-02 ENCOUNTER — Emergency Department (HOSPITAL_COMMUNITY)
Admission: EM | Admit: 2013-12-02 | Discharge: 2013-12-02 | Disposition: A | Discharge status: Home / Self Care | Payer: 59 | Attending: Emergency Medicine | Admitting: Emergency Medicine

## 2013-12-02 DIAGNOSIS — I10 Essential (primary) hypertension: Secondary | ICD-10-CM | POA: Insufficient documentation

## 2013-12-02 DIAGNOSIS — R197 Diarrhea, unspecified: Secondary | ICD-10-CM

## 2013-12-02 DIAGNOSIS — Z8619 Personal history of other infectious and parasitic diseases: Secondary | ICD-10-CM | POA: Insufficient documentation

## 2013-12-02 DIAGNOSIS — K529 Noninfective gastroenteritis and colitis, unspecified: Secondary | ICD-10-CM

## 2013-12-02 DIAGNOSIS — K5289 Other specified noninfective gastroenteritis and colitis: Secondary | ICD-10-CM | POA: Insufficient documentation

## 2013-12-02 DIAGNOSIS — Z7982 Long term (current) use of aspirin: Secondary | ICD-10-CM | POA: Insufficient documentation

## 2013-12-02 DIAGNOSIS — E860 Dehydration: Secondary | ICD-10-CM

## 2013-12-02 DIAGNOSIS — Z8659 Personal history of other mental and behavioral disorders: Secondary | ICD-10-CM | POA: Insufficient documentation

## 2013-12-02 DIAGNOSIS — R112 Nausea with vomiting, unspecified: Secondary | ICD-10-CM

## 2013-12-02 DIAGNOSIS — Z8739 Personal history of other diseases of the musculoskeletal system and connective tissue: Secondary | ICD-10-CM | POA: Insufficient documentation

## 2013-12-02 DIAGNOSIS — Z87448 Personal history of other diseases of urinary system: Secondary | ICD-10-CM | POA: Insufficient documentation

## 2013-12-02 DIAGNOSIS — Z79899 Other long term (current) drug therapy: Secondary | ICD-10-CM | POA: Insufficient documentation

## 2013-12-02 DIAGNOSIS — K219 Gastro-esophageal reflux disease without esophagitis: Secondary | ICD-10-CM | POA: Insufficient documentation

## 2013-12-02 LAB — CBC
HCT: 46.1 % (ref 39.0–52.0)
Hemoglobin: 16.3 g/dL (ref 13.0–17.0)
MCH: 29.8 pg (ref 26.0–34.0)
MCHC: 35.4 g/dL (ref 30.0–36.0)
MCV: 84.3 fL (ref 78.0–100.0)
Platelets: 157 10*3/uL (ref 150–400)
RBC: 5.47 MIL/uL (ref 4.22–5.81)
RDW: 12.8 % (ref 11.5–15.5)
WBC: 8.7 10*3/uL (ref 4.0–10.5)

## 2013-12-02 LAB — COMPREHENSIVE METABOLIC PANEL
ALT: 28 U/L (ref 0–53)
AST: 17 U/L (ref 0–37)
Albumin: 3.5 g/dL (ref 3.5–5.2)
Alkaline Phosphatase: 57 U/L (ref 39–117)
BUN: 18 mg/dL (ref 6–23)
CALCIUM: 8.3 mg/dL — AB (ref 8.4–10.5)
CO2: 23 mEq/L (ref 19–32)
Chloride: 102 mEq/L (ref 96–112)
Creatinine, Ser: 1.04 mg/dL (ref 0.50–1.35)
GFR calc non Af Amer: 79 mL/min — ABNORMAL LOW (ref >90)
Glucose, Bld: 138 mg/dL — ABNORMAL HIGH (ref 70–99)
Potassium: 4 mEq/L (ref 3.7–5.3)
SODIUM: 136 meq/L — AB (ref 137–147)
TOTAL PROTEIN: 6 g/dL (ref 6.0–8.3)
Total Bilirubin: 0.4 mg/dL (ref 0.3–1.2)

## 2013-12-02 LAB — URINALYSIS, ROUTINE W REFLEX MICROSCOPIC
Bilirubin Urine: NEGATIVE
GLUCOSE, UA: NEGATIVE mg/dL
Hgb urine dipstick: NEGATIVE
Ketones, ur: NEGATIVE mg/dL
Leukocytes, UA: NEGATIVE
Nitrite: NEGATIVE
Protein, ur: NEGATIVE mg/dL
UROBILINOGEN UA: 0.2 mg/dL (ref 0.0–1.0)
pH: 7 (ref 5.0–8.0)

## 2013-12-02 LAB — LIPASE, BLOOD: Lipase: 17 U/L (ref 11–59)

## 2013-12-02 MED ORDER — SODIUM CHLORIDE 0.9 % IV BOLUS (SEPSIS)
1000.0000 mL | Freq: Once | INTRAVENOUS | Status: AC
  Administered 2013-12-02: 1000 mL via INTRAVENOUS

## 2013-12-02 MED ORDER — HYDROMORPHONE HCL PF 1 MG/ML IJ SOLN
1.0000 mg | Freq: Once | INTRAMUSCULAR | Status: AC
  Administered 2013-12-02: 1 mg via INTRAVENOUS
  Filled 2013-12-02: qty 1

## 2013-12-02 MED ORDER — IOHEXOL 300 MG/ML  SOLN
100.0000 mL | Freq: Once | INTRAMUSCULAR | Status: AC | PRN
  Administered 2013-12-02: 100 mL via INTRAVENOUS

## 2013-12-02 MED ORDER — IOHEXOL 300 MG/ML  SOLN
50.0000 mL | Freq: Once | INTRAMUSCULAR | Status: AC | PRN
  Administered 2013-12-02: 50 mL via ORAL

## 2013-12-02 MED ORDER — SODIUM CHLORIDE 0.9 % IV BOLUS (SEPSIS)
1000.0000 mL | Freq: Once | INTRAVENOUS | Status: AC
Start: 2013-12-02 — End: 2013-12-02
  Administered 2013-12-02: 1000 mL via INTRAVENOUS

## 2013-12-02 MED ORDER — ONDANSETRON HCL 4 MG/2ML IJ SOLN
4.0000 mg | Freq: Once | INTRAMUSCULAR | Status: AC
  Administered 2013-12-02: 4 mg via INTRAVENOUS
  Filled 2013-12-02: qty 2

## 2013-12-02 MED ORDER — PANTOPRAZOLE SODIUM 40 MG IV SOLR
40.0000 mg | Freq: Once | INTRAVENOUS | Status: AC
  Administered 2013-12-02: 40 mg via INTRAVENOUS
  Filled 2013-12-02: qty 40

## 2013-12-02 MED ORDER — ONDANSETRON HCL 8 MG PO TABS: 8.0000 mg | ORAL_TABLET | Freq: Three times a day (TID) | ORAL | Status: DC | PRN

## 2013-12-02 NOTE — Discharge Instructions (Signed)
Rest. Drink plenty of fluids. Clear liquid diet for the next day, then advance as tolerated. Take zofran as need for nausea. Follow up with primary care doctor (or here if unable to be seen by primary care doctor) in 1 days time for recheck if symptoms fail to improve/resolve. Return to ER right away if worse, persistent vomiting, severe abdominal pain, fevers, other concern.  If recurrent gi issues/symptoms, you may also follow up with gi specialist.    YOU WERE GIVEN PAIN MEDICATION IN THE ER - NO DRIVING FOR THE NEXT 6 HOURS.      Nausea and Vomiting Nausea is a sick feeling that often comes before throwing up (vomiting). Vomiting is a reflex where stomach contents come out of your mouth. Vomiting can cause severe loss of body fluids (dehydration). Children and elderly adults can become dehydrated quickly, especially if they also have diarrhea. Nausea and vomiting are symptoms of a condition or disease. It is important to find the cause of your symptoms. CAUSES   Direct irritation of the stomach lining. This irritation can result from increased acid production (gastroesophageal reflux disease), infection, food poisoning, taking certain medicines (such as nonsteroidal anti-inflammatory drugs), alcohol use, or tobacco use.  Signals from the brain.These signals could be caused by a headache, heat exposure, an inner ear disturbance, increased pressure in the brain from injury, infection, a tumor, or a concussion, pain, emotional stimulus, or metabolic problems.  An obstruction in the gastrointestinal tract (bowel obstruction).  Illnesses such as diabetes, hepatitis, gallbladder problems, appendicitis, kidney problems, cancer, sepsis, atypical symptoms of a heart attack, or eating disorders.  Medical treatments such as chemotherapy and radiation.  Receiving medicine that makes you sleep (general anesthetic) during surgery. DIAGNOSIS Your caregiver may ask for tests to be done if the  problems do not improve after a few days. Tests may also be done if symptoms are severe or if the reason for the nausea and vomiting is not clear. Tests may include:  Urine tests.  Blood tests.  Stool tests.  Cultures (to look for evidence of infection).  X-rays or other imaging studies. Test results can help your caregiver make decisions about treatment or the need for additional tests. TREATMENT You need to stay well hydrated. Drink frequently but in small amounts.You may wish to drink water, sports drinks, clear broth, or eat frozen ice pops or gelatin dessert to help stay hydrated.When you eat, eating slowly may help prevent nausea.There are also some antinausea medicines that may help prevent nausea. HOME CARE INSTRUCTIONS   Take all medicine as directed by your caregiver.  If you do not have an appetite, do not force yourself to eat. However, you must continue to drink fluids.  If you have an appetite, eat a normal diet unless your caregiver tells you differently.  Eat a variety of complex carbohydrates (rice, wheat, potatoes, bread), lean meats, yogurt, fruits, and vegetables.  Avoid high-fat foods because they are more difficult to digest.  Drink enough water and fluids to keep your urine clear or pale yellow.  If you are dehydrated, ask your caregiver for specific rehydration instructions. Signs of dehydration may include:  Severe thirst.  Dry lips and mouth.  Dizziness.  Dark urine.  Decreasing urine frequency and amount.  Confusion.  Rapid breathing or pulse. SEEK IMMEDIATE MEDICAL CARE IF:   You have blood or brown flecks (like coffee grounds) in your vomit.  You have black or bloody stools.  You have a severe headache or stiff  neck.  You are confused.  You have severe abdominal pain.  You have chest pain or trouble breathing.  You do not urinate at least once every 8 hours.  You develop cold or clammy skin.  You continue to vomit for longer  than 24 to 48 hours.  You have a fever. MAKE SURE YOU:   Understand these instructions.  Will watch your condition.  Will get help right away if you are not doing well or get worse. Document Released: 10/11/2005 Document Revised: 01/03/2012 Document Reviewed: 03/10/2011 [facility] Patient Information 2014 [place], [place].    Diarrhea Diarrhea is frequent loose and watery bowel movements. It can cause you to feel weak and dehydrated. Dehydration can cause you to become tired and thirsty, have a dry mouth, and have decreased urination that often is dark yellow. Diarrhea is a sign of another problem, most often an infection that will not last long. In most cases, diarrhea typically lasts 2 3 days. However, it can last longer if it is a sign of something more serious. It is important to treat your diarrhea as directed by your caregive to lessen or prevent future episodes of diarrhea. CAUSES  Some common causes include:  Gastrointestinal infections caused by viruses, bacteria, or parasites.  Food poisoning or food allergies.  Certain medicines, such as antibiotics, chemotherapy, and laxatives.  Artificial sweeteners and fructose.  Digestive disorders. HOME CARE INSTRUCTIONS  Ensure adequate fluid intake (hydration): have 1 cup (8 oz) of fluid for each diarrhea episode. Avoid fluids that contain simple sugars or sports drinks, fruit juices, whole milk products, and sodas. Your urine should be clear or pale yellow if you are drinking enough fluids. Hydrate with an oral rehydration solution that you can purchase at pharmacies, retail stores, and online. You can prepare an oral rehydration solution at home by mixing the following ingredients together:    tsp table salt.   tsp baking soda.   tsp salt substitute containing potassium chloride.  1  tablespoons sugar.  1 L (34 oz) of water.  Certain foods and beverages may increase the speed at which food moves through the  gastrointestinal (GI) tract. These foods and beverages should be avoided and include:  Caffeinated and alcoholic beverages.  High-fiber foods, such as raw fruits and vegetables, nuts, seeds, and whole grain breads and cereals.  Foods and beverages sweetened with sugar alcohols, such as xylitol, sorbitol, and mannitol.  Some foods may be well tolerated and may help thicken stool including:  Starchy foods, such as rice, toast, pasta, low-sugar cereal, oatmeal, grits, baked potatoes, crackers, and bagels.  Bananas.  Applesauce.  Add probiotic-rich foods to help increase healthy bacteria in the GI tract, such as yogurt and fermented milk products.  Wash your hands well after each diarrhea episode.  Only take over-the-counter or prescription medicines as directed by your caregiver.  Take a warm bath to relieve any burning or pain from frequent diarrhea episodes. SEEK IMMEDIATE MEDICAL CARE IF:   You are unable to keep fluids down.  You have persistent vomiting.  You have blood in your stool, or your stools are black and tarry.  You do not urinate in 6 8 hours, or there is only a small amount of very dark urine.  You have abdominal pain that increases or localizes.  You have weakness, dizziness, confusion, or lightheadedness.  You have a severe headache.  Your diarrhea gets worse or does not get better.  You have a fever or persistent symptoms for more  than 2 3 days.  You have a fever and your symptoms suddenly get worse. MAKE SURE YOU:   Understand these instructions.  Will watch your condition.  Will get help right away if you are not doing well or get worse. Document Released: 10/01/2002 Document Revised: 09/27/2012 Document Reviewed: 06/18/2012 [facility] Patient Information 2014 [place], [place].    Abdominal Pain, Adult Many things can cause abdominal pain. Usually, abdominal pain is not caused by a disease and will improve without treatment. It can often be  observed and treated at home. Your health care provider will do a physical exam and possibly order blood tests and X-rays to help determine the seriousness of your pain. However, in many cases, more time must pass before a clear cause of the pain can be found. Before that point, your health care provider may not know if you need more testing or further treatment. HOME CARE INSTRUCTIONS  Monitor your abdominal pain for any changes. The following actions may help to alleviate any discomfort you are experiencing:  Only take over-the-counter or prescription medicines as directed by your health care provider.  Do not take laxatives unless directed to do so by your health care provider.  Try a clear liquid diet (broth, tea, or water) as directed by your health care provider. Slowly move to a bland diet as tolerated. SEEK MEDICAL CARE IF:  You have unexplained abdominal pain.  You have abdominal pain associated with nausea or diarrhea.  You have pain when you urinate or have a bowel movement.  You experience abdominal pain that wakes you in the night.  You have abdominal pain that is worsened or improved by eating food.  You have abdominal pain that is worsened with eating fatty foods. SEEK IMMEDIATE MEDICAL CARE IF:   Your pain does not go away within 2 hours.  You have a fever.  You keep throwing up (vomiting).  Your pain is felt only in portions of the abdomen, such as the right side or the left lower portion of the abdomen.  You pass bloody or black tarry stools. MAKE SURE YOU:  Understand these instructions.   Will watch your condition.   Will get help right away if you are not doing well or get worse.  Document Released: 07/21/2005 Document Revised: 08/01/2013 Document Reviewed: 06/20/2013 [facility] Patient Information 2014 [place].     Viral Gastroenteritis Viral gastroenteritis is also known as stomach flu. This condition affects the stomach and intestinal  tract. It can cause sudden diarrhea and vomiting. The illness typically lasts 3 to 8 days. Most people develop an immune response that eventually gets rid of the virus. While this natural response develops, the virus can make you quite ill. CAUSES  Many different viruses can cause gastroenteritis, such as rotavirus or noroviruses. You can catch one of these viruses by consuming contaminated food or water. You may also catch a virus by sharing utensils or other personal items with an infected person or by touching a contaminated surface. SYMPTOMS  The most common symptoms are diarrhea and vomiting. These problems can cause a severe loss of body fluids (dehydration) and a body salt (electrolyte) imbalance. Other symptoms may include:  Fever.  Headache.  Fatigue.  Abdominal pain. DIAGNOSIS  Your caregiver can usually diagnose viral gastroenteritis based on your symptoms and a physical exam. A stool sample may also be taken to test for the presence of viruses or other infections. TREATMENT  This illness typically goes away on its own. Treatments  are aimed at rehydration. The most serious cases of viral gastroenteritis involve vomiting so severely that you are not able to keep fluids down. In these cases, fluids must be given through an intravenous line (IV). HOME CARE INSTRUCTIONS   Drink enough fluids to keep your urine clear or pale yellow. Drink small amounts of fluids frequently and increase the amounts as tolerated.  Ask your caregiver for specific rehydration instructions.  Avoid:  Foods high in sugar.  Alcohol.  Carbonated drinks.  Tobacco.  Juice.  Caffeine drinks.  Extremely hot or cold fluids.  Fatty, greasy foods.  Too much intake of anything at one time.  Dairy products until 24 to 48 hours after diarrhea stops.  You may consume probiotics. Probiotics are active cultures of beneficial bacteria. They may lessen the amount and number of diarrheal stools in adults.  Probiotics can be found in yogurt with active cultures and in supplements.  Wash your hands well to avoid spreading the virus.  Only take over-the-counter or prescription medicines for pain, discomfort, or fever as directed by your caregiver. Do not give aspirin to children. Antidiarrheal medicines are not recommended.  Ask your caregiver if you should continue to take your regular prescribed and over-the-counter medicines.  Keep all follow-up appointments as directed by your caregiver. SEEK IMMEDIATE MEDICAL CARE IF:   You are unable to keep fluids down.  You do not urinate at least once every 6 to 8 hours.  You develop shortness of breath.  You notice blood in your stool or vomit. This may look like coffee grounds.  You have abdominal pain that increases or is concentrated in one small area (localized).  You have persistent vomiting or diarrhea.  You have a fever.  The patient is a child younger than 3 months, and he or she has a fever.  The patient is a child older than 3 months, and he or she has a fever and persistent symptoms.  The patient is a child older than 3 months, and he or she has a fever and symptoms suddenly get worse.  The patient is a baby, and he or she has no tears when crying. MAKE SURE YOU:   Understand these instructions.  Will watch your condition.  Will get help right away if you are not doing well or get worse. Document Released: 10/11/2005 Document Revised: 01/03/2012 Document Reviewed: 07/28/2011 [facility] Patient Information 2014 [place].

## 2013-12-02 NOTE — ED Notes (Signed)
Bed: WA05 Expected date:  Expected time:  Means of arrival:  Comments: 

## 2013-12-02 NOTE — ED Provider Notes (Signed)
CSN: [id number]     Arrival date & time 12/02/13  [id number] History   First MD Initiated Contact with Patient 12/02/13 [phone]     Chief Complaint  Patient presents with  . Abdominal Pain   (Consider location/radiation/quality/duration/timing/severity/associated sxs/prior Treatment) Patient is a 58 y.o. male presenting with abdominal pain. The history is provided by the patient.  Abdominal Pain Associated symptoms: diarrhea and vomiting   Associated symptoms: no chest pain, no cough, no dysuria, no fever, no shortness of breath and no sore throat   pt c/o nvd for the past day. Started yesterday morning. Several episodes of both diarrhea and vomiting. Diarrhea watery, not bloody or mucousy. Emesis, clear or color of recent ingested fluids, not bloody or bilious. Epigastric, crampy discomfort, non radiating, dull, mild-mod. No back or flank pain. No hx pud, pancreatitis or gallstones. No prior abd surgery. No known bad food ingestion, recent travel, ill contacts, or new med/abx use. No feer or chills. No gu c/o.     Past Medical History  Diagnosis Date  . GERD (gastroesophageal reflux disease)   . HTN (hypertension)   . Genital herpes   . Cold sore   . Plantar fasciitis   . Anxiety   . BPH (benign prostatic hypertrophy)    History reviewed. No pertinent past surgical history. Family History  Problem Relation Age of Onset  . Arthritis Other   . Hypertension Other   . Heart disease Father   . Cancer Neg Hx   . COPD Neg Hx   . Diabetes Neg Hx   . Drug abuse Neg Hx   . Early death Neg Hx   . Hyperlipidemia Neg Hx   . Kidney disease Neg Hx   . Stroke Neg Hx    History  Substance Use Topics  . Smoking status: Never Smoker   . Smokeless tobacco: Never Used  . Alcohol Use: No    Review of Systems  Constitutional: Negative for fever.  HENT: Negative for sore throat.   Eyes: Negative for redness.  Respiratory: Negative for cough and shortness of breath.   Cardiovascular: Negative for  chest pain.  Gastrointestinal: Positive for vomiting, abdominal pain and diarrhea.  Genitourinary: Negative for dysuria and flank pain.  Musculoskeletal: Negative for back pain and neck pain.  Skin: Negative for rash.  Neurological: Negative for headaches.  Hematological: Does not bruise/bleed easily.  Psychiatric/Behavioral: Negative for confusion.    Allergies  Sulfamethoxazole  Home Medications   Current Outpatient Rx  Name  Route  Sig  Dispense  Refill  . lisinopril (PRINIVIL,ZESTRIL) 40 MG tablet      take 1 tablet by mouth once daily   90 tablet   3   . omeprazole (PRILOSEC) 20 MG capsule   Oral   Take 20 mg by mouth daily. 30 minutes before a meal         . OVER THE COUNTER MEDICATION   Oral   Take 1 tablet by mouth 2 (two) times daily. Has aspirin and tylenol in it for pain         . valACYclovir (VALTREX) 1000 MG tablet      take 1 tablet by mouth twice a day   60 tablet   11    BP 153/100  Pulse 103  Temp(Src) 99.1 F (37.3 C) (Oral)  Resp 16  SpO2 99% Physical Exam  Nursing note and vitals reviewed. Constitutional: He is oriented to person, place, and time. He appears well-developed and well-nourished. No  distress.  HENT:  Head: Atraumatic.  Mouth/Throat: Oropharynx is clear and moist.  Eyes: Conjunctivae are normal. No scleral icterus.  Neck: Neck supple. No tracheal deviation present.  Cardiovascular: Normal rate, regular rhythm, normal heart sounds and intact distal pulses.   Pulmonary/Chest: Effort normal and breath sounds normal. No accessory muscle usage. No respiratory distress.  Abdominal: Soft. Bowel sounds are normal. He exhibits no distension and no mass. There is no tenderness. There is no rebound and no guarding.  Genitourinary:  No cva tenderness  Musculoskeletal: Normal range of motion. He exhibits no edema and no tenderness.  Neurological: He is alert and oriented to person, place, and time.  Skin: Skin is warm and dry.   Psychiatric: He has a normal mood and affect.    ED Course  Procedures (including critical care time)  Results for orders placed during the hospital encounter of 12/02/13  CBC      Result Value Range   WBC 8.7  4.0 - 10.5 K/uL   RBC 5.47  4.22 - 5.81 MIL/uL   Hemoglobin 16.3  13.0 - 17.0 g/dL   HCT 46.1  39.0 - 52.0 %   MCV 84.3  78.0 - 100.0 fL   MCH 29.8  26.0 - 34.0 pg   MCHC 35.4  30.0 - 36.0 g/dL   RDW 12.8  11.5 - 15.5 %   Platelets 157  150 - 400 K/uL  COMPREHENSIVE METABOLIC PANEL      Result Value Range   Sodium 136 (*) 137 - 147 mEq/L   Potassium 4.0  3.7 - 5.3 mEq/L   Chloride 102  96 - 112 mEq/L   CO2 23  19 - 32 mEq/L   Glucose, Bld 138 (*) 70 - 99 mg/dL   BUN 18  6 - 23 mg/dL   Creatinine, Ser 1.04  0.50 - 1.35 mg/dL   Calcium 8.3 (*) 8.4 - 10.5 mg/dL   Total Protein 6.0  6.0 - 8.3 g/dL   Albumin 3.5  3.5 - 5.2 g/dL   AST 17  0 - 37 U/L   ALT 28  0 - 53 U/L   Alkaline Phosphatase 57  39 - 117 U/L   Total Bilirubin 0.4  0.3 - 1.2 mg/dL   GFR calc non Af Amer 79 (*) >90 mL/min   GFR calc Af Amer >90  >90 mL/min  LIPASE, BLOOD      Result Value Range   Lipase 17  11 - 59 U/L  URINALYSIS, ROUTINE W REFLEX MICROSCOPIC      Result Value Range   Color, Urine YELLOW  YELLOW   APPearance CLEAR  CLEAR   Specific Gravity, Urine >1.046 (*) 1.005 - 1.030   pH 7.0  5.0 - 8.0   Glucose, UA NEGATIVE  NEGATIVE mg/dL   Hgb urine dipstick NEGATIVE  NEGATIVE   Bilirubin Urine NEGATIVE  NEGATIVE   Ketones, ur NEGATIVE  NEGATIVE mg/dL   Protein, ur NEGATIVE  NEGATIVE mg/dL   Urobilinogen, UA 0.2  0.0 - 1.0 mg/dL   Nitrite NEGATIVE  NEGATIVE   Leukocytes, UA NEGATIVE  NEGATIVE   Ct Abdomen Pelvis W Contrast  12/02/2013   CLINICAL DATA:  Abdominal pain with nausea and vomiting  EXAM: CT ABDOMEN AND PELVIS WITH CONTRAST  TECHNIQUE: Multidetector CT imaging of the abdomen and pelvis was performed using the standard protocol following bolus administration of intravenous  contrast.  CONTRAST:  56mL OMNIPAQUE IOHEXOL 300 MG/ML SOLN, 165mL OMNIPAQUE IOHEXOL 300 MG/ML  SOLN  COMPARISON:  None.  FINDINGS: Lung bases are free of acute infiltrate or sizable effusion.  The liver, gallbladder, spleen, adrenal glands and pancreas are within normal limits. The kidneys are well visualized and demonstrate a normal enhancement pattern. A tiny cystic lesion is noted in the upper pole of the left kidney. Appendix is well visualized and within normal limits. Scattered loops of mildly dilated small bowel are noted in the mid and right abdomen. These involve primarily the distal jejunum and entire ileum. The terminal ileum however is within normal limits. No definitive transition zone is seen. These changes the bladder is well distended. No pelvic mass lesion is seen. Mild diverticular change of the colon is noted without diverticulitis. May represent a small bowel ileus.  IMPRESSION: Dilated loops of small bowel involving the jejunum and ileum. No definitive transition zone is seen. Correlation with the physical exam is recommended as this may represent a small bowel ileus.  No other acute abnormality is seen.   Electronically Signed   By: Inez Catalina M.D.   On: 12/02/2013 11:12      MDM  Iv ns bolus. zofran iv. protonix iv. Labs.  Reviewed nursing notes and prior charts for additional history.   Recheck post ivf, zofran.  Pt notes abd pain persists, and that now it has moved a bit lower in abdomen. +mid to lower abd tenderness without peritoneal signs.  Will get ct.   Dilaudid 1 mg iv. zofran iv.   Ct w dil loops sb, ?ileus per radiologist.  Pt w no incarc hernia, no hx prior abd surgery.  On repeat exam, abd exam benign. abd soft nt.   Pt tolerating po fluids. No recurrent nvd in ed.   Discussed w pt, plan recheck 24 hrs if symptoms fail to improve/resolve. Pcp/gi follow up if symptoms recur.   Pt appears comfortable, tolerating po, and stable for d/c.     Mirna Mires,  MD 12/02/13 1355

## 2013-12-02 NOTE — ED Notes (Signed)
Pt arrived to the ED with a complaint of abdominal pain.  Pt has also been experiencing emesis and nausea.  Pt has had more than 3 bouts of emesis prior to arrival.  Pt has experienced continual diarrhea for the same amount of time.  Pt is unable to hold liquids or food.  Pt abdominal pain is located upper medially on his abdomen.

## 2014-02-20 ENCOUNTER — Other Ambulatory Visit: Payer: Self-pay | Admitting: Internal Medicine

## 2014-04-12 IMAGING — CT CT ABD-PELV W/ CM
1 of 3 series · 14 of 32 positions shown, 19 images · IV contrast (OMNIPAQUE 300)
Comparison: None.

CLINICAL DATA: Abdominal pain with nausea and vomiting

EXAM:
CT ABDOMEN AND PELVIS WITH CONTRAST
TECHNIQUE: Multidetector CT imaging of the abdomen and pelvis was performed
using the standard protocol following bolus administration of
intravenous contrast.
CONTRAST:  50mL OMNIPAQUE IOHEXOL 300 MG/ML SOLN, 100mL OMNIPAQUE
IOHEXOL 300 MG/ML SOLN

[Series 2: abd/pel with · axial · 0.78mm/px · z∈[-465,-40]mm · 14 of 96 slices shown, 19 images]
[im 6/96  soft-tissue]
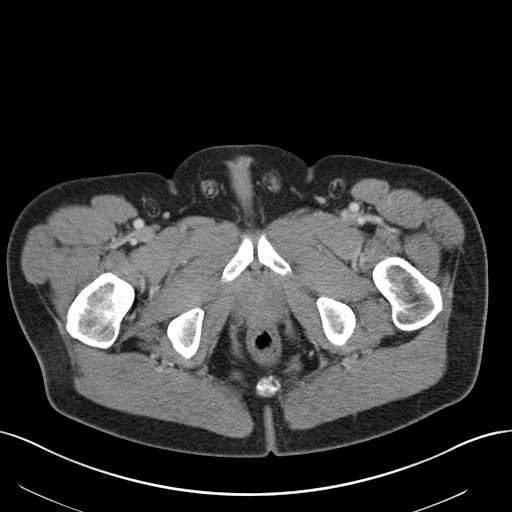
[im 6/96  bone]
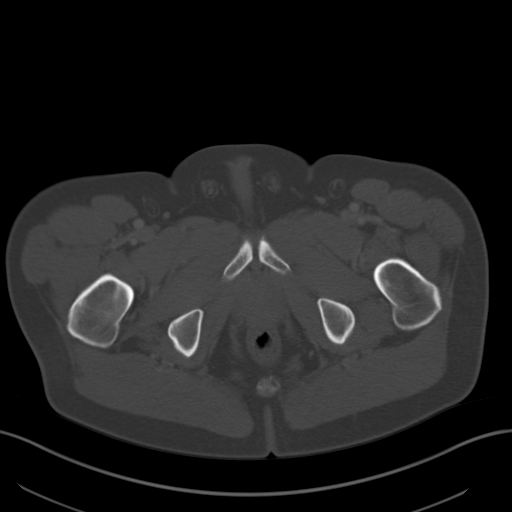
[im 16/96  soft-tissue]
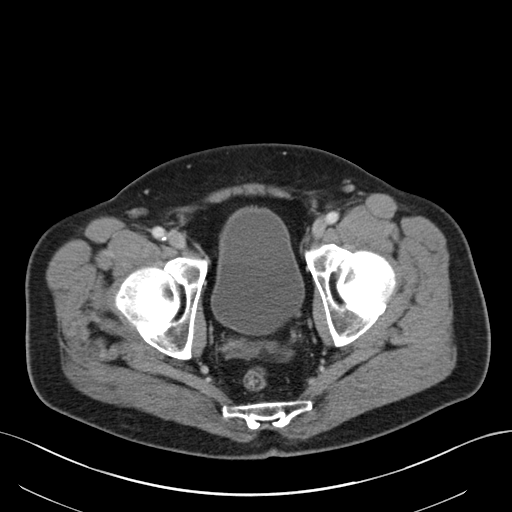
[im 21/96  soft-tissue]
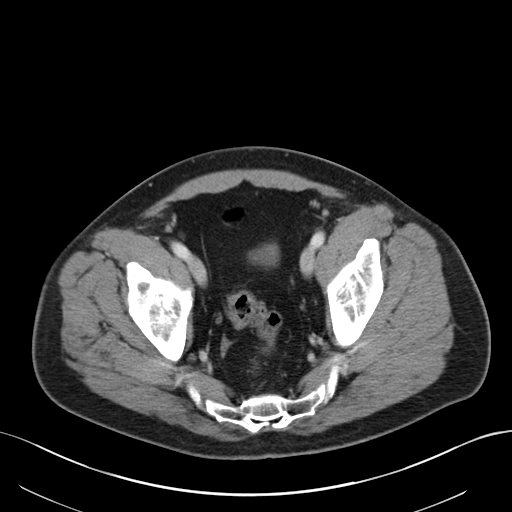
[im 26/96  soft-tissue]
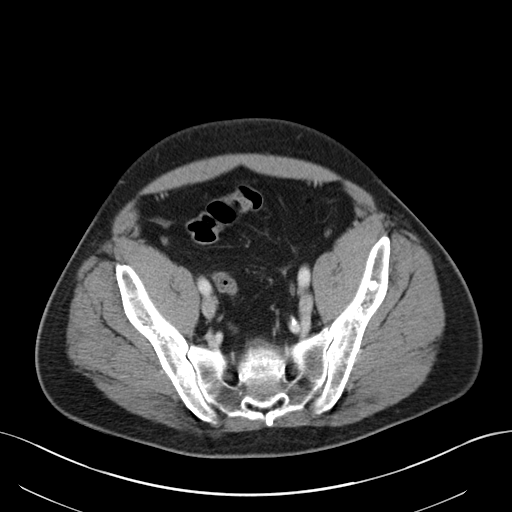
[im 36/96  soft-tissue]
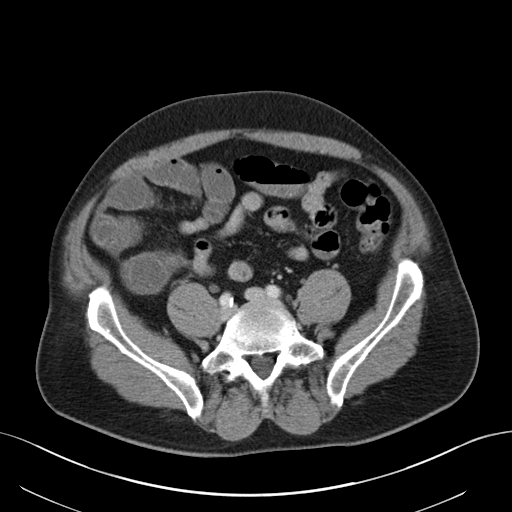
[im 41/96  soft-tissue]
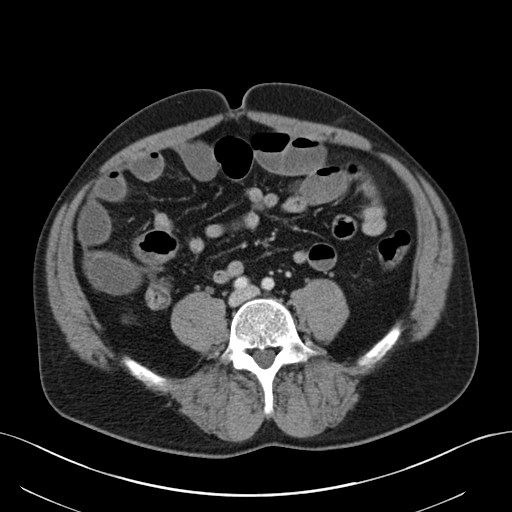
[im 51/96  soft-tissue]
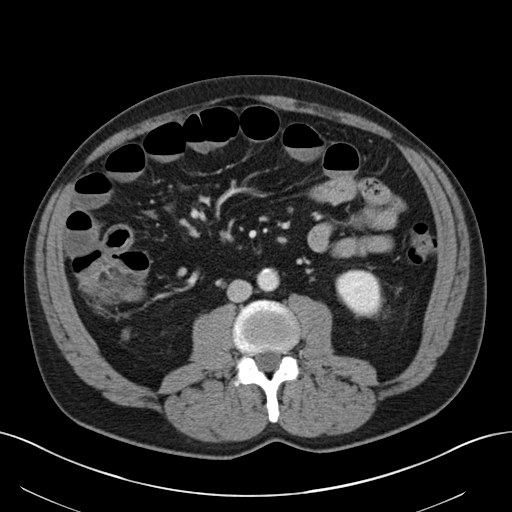
[im 56/96  soft-tissue]
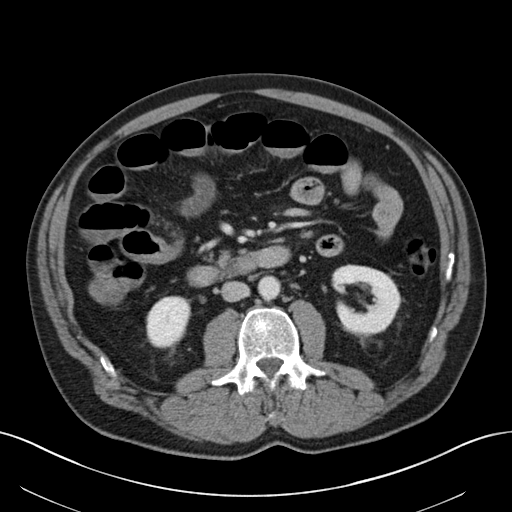
[im 61/96  soft-tissue]
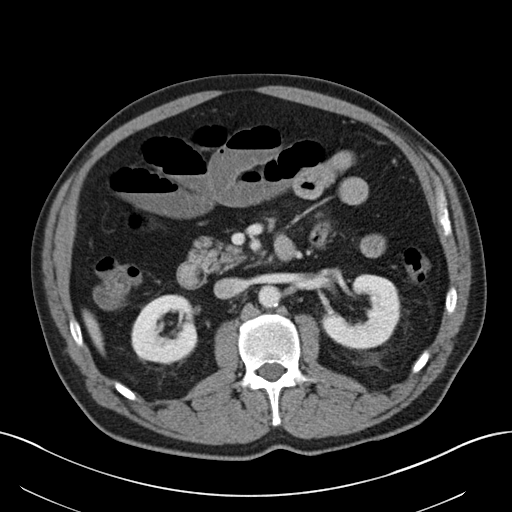
[im 61/96  bone]
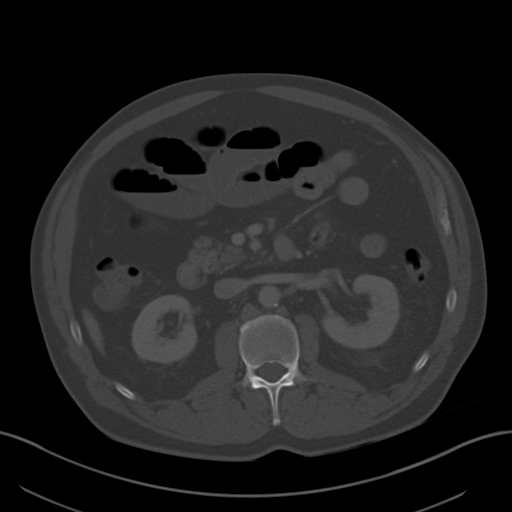
[im 71/96  soft-tissue]
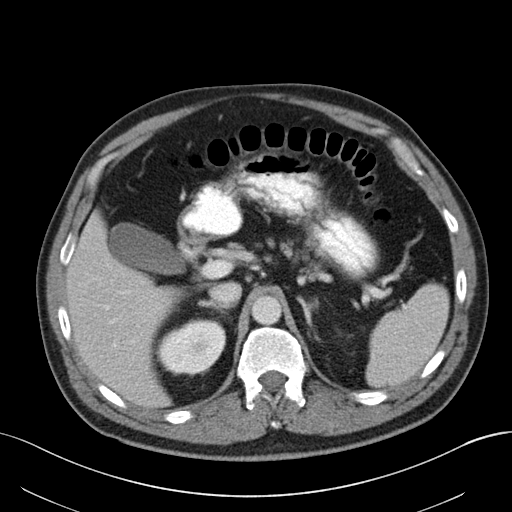
[im 76/96  soft-tissue]
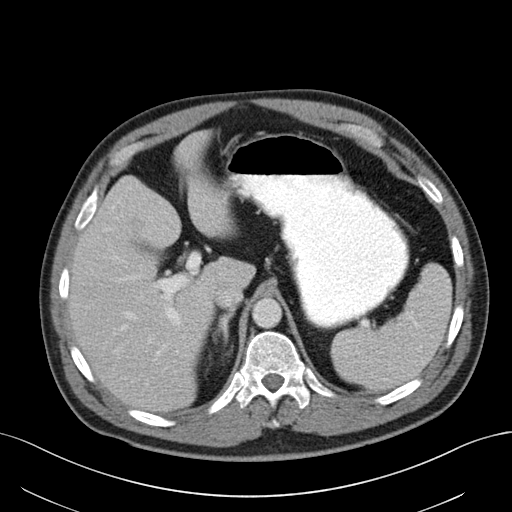
[im 76/96  lung]
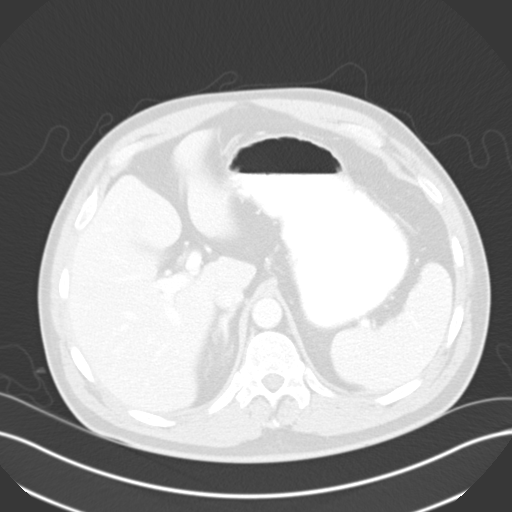
[im 81/96  soft-tissue]
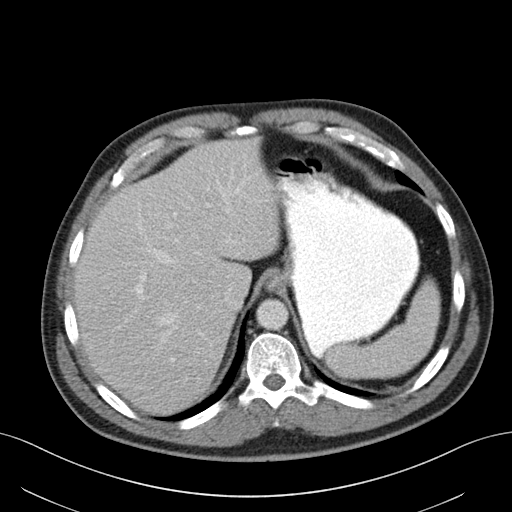
[im 81/96  lung]
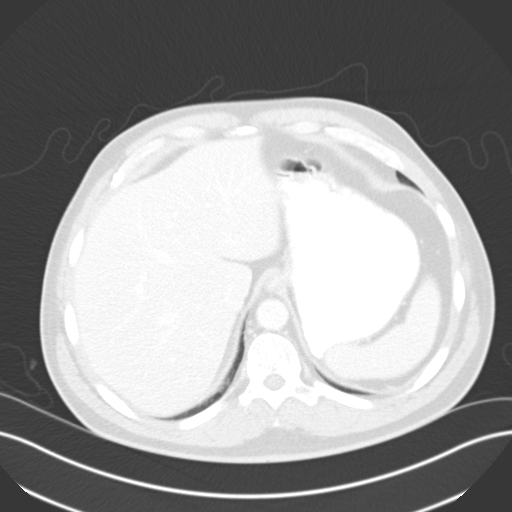
[im 86/96  lung]
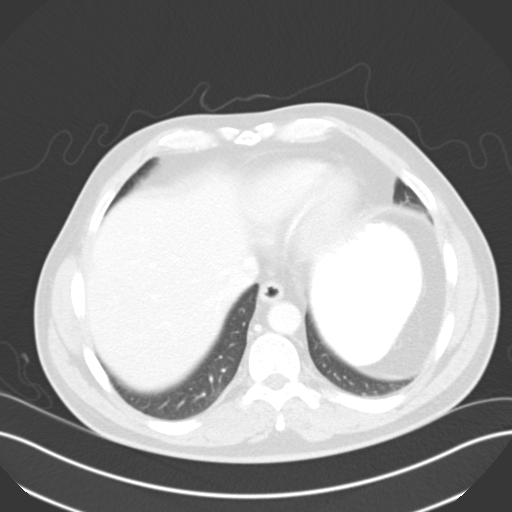
[im 91/96  soft-tissue]
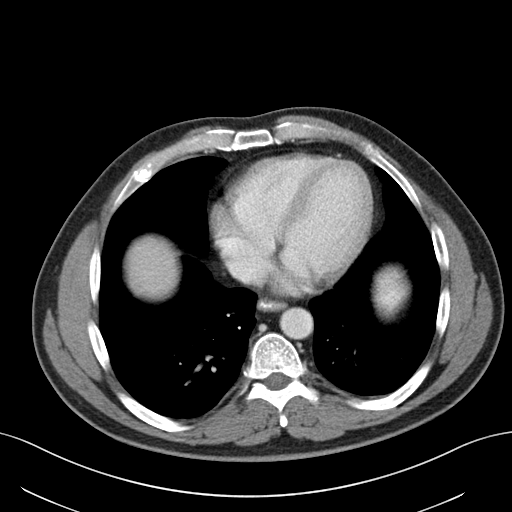
[im 91/96  lung]
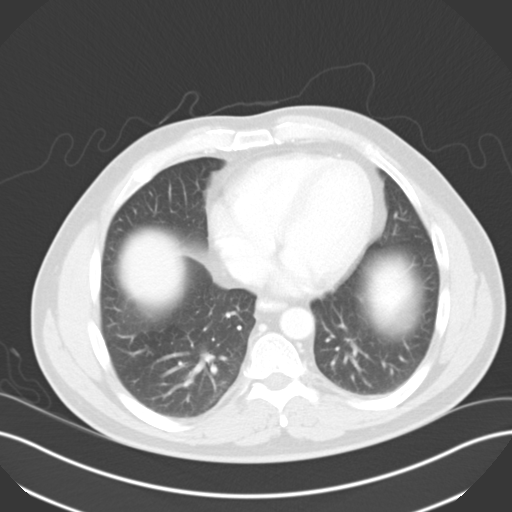

[14 of 32 positions shown; findings below may reference images not displayed]

FINDINGS: Lung bases are free of acute infiltrate or sizable effusion.

The liver, gallbladder, spleen, adrenal glands and pancreas are
within normal limits. The kidneys are well visualized and
demonstrate a normal enhancement pattern. A tiny cystic lesion is
noted in the upper pole of the left kidney. Appendix is well
visualized and within normal limits. Scattered loops of mildly
dilated small bowel are noted in the mid and right abdomen. These
involve primarily the distal jejunum and entire ileum. The terminal
ileum however is within normal limits. No definitive transition zone
is seen. These changes the bladder is well distended. No pelvic mass
lesion is seen. Mild diverticular change of the colon is noted
without diverticulitis. May represent a small bowel ileus.
IMPRESSION: Dilated loops of small bowel involving the jejunum and ileum. No
definitive transition zone is seen. Correlation with the physical
exam is recommended as this may represent a small bowel ileus.

No other acute abnormality is seen.

## 2014-04-17 ENCOUNTER — Other Ambulatory Visit: Payer: Self-pay | Admitting: Internal Medicine

## 2014-05-27 ENCOUNTER — Other Ambulatory Visit: Payer: Self-pay

## 2014-05-27 MED ORDER — LISINOPRIL 40 MG PO TABS: ORAL_TABLET | ORAL | Status: DC

## 2014-07-30 ENCOUNTER — Other Ambulatory Visit: Payer: Self-pay | Admitting: Internal Medicine

## 2014-08-12 ENCOUNTER — Ambulatory Visit (INDEPENDENT_AMBULATORY_CARE_PROVIDER_SITE_OTHER): Payer: 59 | Admitting: Internal Medicine

## 2014-08-12 ENCOUNTER — Encounter: Payer: Self-pay | Admitting: Internal Medicine

## 2014-08-12 ENCOUNTER — Other Ambulatory Visit (INDEPENDENT_AMBULATORY_CARE_PROVIDER_SITE_OTHER): Payer: 59

## 2014-08-12 VITALS — BP 130/82 | HR 60 | Temp 98.5°F | Resp 16 | Ht 67.0 in | Wt 195.0 lb

## 2014-08-12 DIAGNOSIS — R739 Hyperglycemia, unspecified: Secondary | ICD-10-CM

## 2014-08-12 DIAGNOSIS — F411 Generalized anxiety disorder: Secondary | ICD-10-CM

## 2014-08-12 DIAGNOSIS — I10 Essential (primary) hypertension: Secondary | ICD-10-CM

## 2014-08-12 DIAGNOSIS — K222 Esophageal obstruction: Secondary | ICD-10-CM

## 2014-08-12 DIAGNOSIS — Z0000 Encounter for general adult medical examination without abnormal findings: Secondary | ICD-10-CM

## 2014-08-12 DIAGNOSIS — K2100 Gastro-esophageal reflux disease with esophagitis, without bleeding: Secondary | ICD-10-CM

## 2014-08-12 LAB — TSH: TSH: 1.8 u[IU]/mL (ref 0.35–4.50)

## 2014-08-12 LAB — COMPREHENSIVE METABOLIC PANEL
ALK PHOS: 65 U/L (ref 39–117)
ALT: 27 U/L (ref 0–53)
AST: 20 U/L (ref 0–37)
Albumin: 3.8 g/dL (ref 3.5–5.2)
BUN: 14 mg/dL (ref 6–23)
CALCIUM: 9.2 mg/dL (ref 8.4–10.5)
CO2: 25 meq/L (ref 19–32)
Chloride: 103 mEq/L (ref 96–112)
Creatinine, Ser: 1.2 mg/dL (ref 0.4–1.5)
GFR: 66.48 mL/min (ref >60.00)
GLUCOSE: 94 mg/dL (ref 70–99)
POTASSIUM: 4.3 meq/L (ref 3.5–5.1)
Sodium: 140 mEq/L (ref 135–145)
TOTAL PROTEIN: 6.9 g/dL (ref 6.0–8.3)
Total Bilirubin: 0.7 mg/dL (ref 0.2–1.2)

## 2014-08-12 LAB — LIPID PANEL
CHOLESTEROL: 145 mg/dL (ref 0–200)
HDL: 16.8 mg/dL — ABNORMAL LOW (ref >39.00)
LDL CALC: 103 mg/dL — AB (ref 0–99)
NonHDL: 128.2
Total CHOL/HDL Ratio: 9
Triglycerides: 128 mg/dL (ref 0.0–149.0)
VLDL: 25.6 mg/dL (ref 0.0–40.0)

## 2014-08-12 LAB — CBC WITH DIFFERENTIAL/PLATELET
Basophils Absolute: 0 10*3/uL (ref 0.0–0.1)
Basophils Relative: 0.6 % (ref 0.0–3.0)
Eosinophils Absolute: 0.2 10*3/uL (ref 0.0–0.7)
Eosinophils Relative: 2.9 % (ref 0.0–5.0)
HCT: 47.2 % (ref 39.0–52.0)
Hemoglobin: 16.1 g/dL (ref 13.0–17.0)
LYMPHS PCT: 28.8 % (ref 12.0–46.0)
Lymphs Abs: 2.4 10*3/uL (ref 0.7–4.0)
MCHC: 34 g/dL (ref 30.0–36.0)
MCV: 84.2 fl (ref 78.0–100.0)
MONOS PCT: 8.2 % (ref 3.0–12.0)
Monocytes Absolute: 0.7 10*3/uL (ref 0.1–1.0)
NEUTROS PCT: 59.5 % (ref 43.0–77.0)
Neutro Abs: 4.9 10*3/uL (ref 1.4–7.7)
PLATELETS: 206 10*3/uL (ref 150.0–400.0)
RBC: 5.61 Mil/uL (ref 4.22–5.81)
RDW: 12.6 % (ref 11.5–15.5)
WBC: 8.3 10*3/uL (ref 4.0–10.5)

## 2014-08-12 LAB — HEMOGLOBIN A1C: Hgb A1c MFr Bld: 5.4 % (ref 4.6–6.5)

## 2014-08-12 LAB — PSA: PSA: 0.96 ng/mL (ref 0.10–4.00)

## 2014-08-12 NOTE — Progress Notes (Signed)
Subjective:    Patient ID: Bradley Conner, male    DOB: 08-Jun-1956, 58 y.o.   MRN: [id number]  Hypertension This is a chronic problem. The current episode started more than 1 year ago. The problem has been gradually improving since onset. The problem is controlled. Associated symptoms include anxiety. Pertinent negatives include no blurred vision, chest pain, headaches, malaise/fatigue, neck pain, orthopnea, palpitations, peripheral edema, PND, shortness of breath or sweats. There are no associated agents to hypertension. Past treatments include alpha 1 blockers. The current treatment provides moderate improvement. Compliance problems include diet and exercise.       Review of Systems  Constitutional: Negative.  Negative for fever, chills, malaise/fatigue, diaphoresis, appetite change and fatigue.  HENT: Negative.   Eyes: Negative.  Negative for blurred vision.  Respiratory: Negative.  Negative for cough, choking, chest tightness, shortness of breath, wheezing and stridor.   Cardiovascular: Negative.  Negative for chest pain, palpitations, orthopnea, leg swelling and PND.  Gastrointestinal: Negative.  Negative for nausea, vomiting, abdominal pain, diarrhea, constipation and blood in stool.  Endocrine: Negative.   Genitourinary: Negative.  Negative for difficulty urinating.  Musculoskeletal: Negative.  Negative for arthralgias, back pain and neck pain.  Skin: Negative.  Negative for rash.  Allergic/Immunologic: Negative.   Neurological: Negative.  Negative for dizziness, tremors, syncope, light-headedness, numbness and headaches.  Hematological: Negative.  Negative for adenopathy. Does not bruise/bleed easily.  Psychiatric/Behavioral: Negative.        Objective:   Physical Exam  Vitals reviewed. Constitutional: He is oriented to person, place, and time. He appears well-developed and well-nourished. No distress.  HENT:  Head: Normocephalic and atraumatic.  Mouth/Throat: Oropharynx  is clear and moist. No oropharyngeal exudate.  Eyes: Conjunctivae are normal. Right eye exhibits no discharge. Left eye exhibits no discharge. No scleral icterus.  Neck: Normal range of motion. Neck supple. No JVD present. No tracheal deviation present. No thyromegaly present.  Cardiovascular: Normal rate, regular rhythm, normal heart sounds and intact distal pulses.  Exam reveals no gallop and no friction rub.   No murmur heard. Pulmonary/Chest: Effort normal and breath sounds normal. No stridor. No respiratory distress. He has no wheezes. He has no rales. He exhibits no tenderness.  Abdominal: Soft. Bowel sounds are normal. He exhibits no distension and no mass. There is no tenderness. There is no rebound and no guarding. Hernia confirmed negative in the right inguinal area and confirmed negative in the left inguinal area.  Genitourinary: Rectum normal, testes normal and penis normal. Rectal exam shows no external hemorrhoid, no internal hemorrhoid, no fissure, no mass, no tenderness and anal tone normal. Guaiac negative stool. Prostate is enlarged (2+ smooth symm BPH). Prostate is not tender. Right testis shows no mass, no swelling and no tenderness. Right testis is descended. Left testis shows no mass, no swelling and no tenderness. Left testis is descended. Circumcised. No penile erythema or penile tenderness. No discharge found.  Musculoskeletal: Normal range of motion. He exhibits no edema and no tenderness.  Lymphadenopathy:    He has no cervical adenopathy.       Right: No inguinal adenopathy present.       Left: No inguinal adenopathy present.  Neurological: He is oriented to person, place, and time.  Skin: Skin is warm and dry. No rash noted. He is not diaphoretic. No erythema. No pallor.  Psychiatric: He has a normal mood and affect. His behavior is normal. Judgment and thought content normal.     Lab Results  Component Value Date   WBC 8.7 12/02/2013   HGB 16.3 12/02/2013   HCT 46.1  12/02/2013   PLT 157 12/02/2013   GLUCOSE 138* 12/02/2013   CHOL 140 07/03/2013   TRIG 152.0* 07/03/2013   HDL 21.60* 07/03/2013   LDLCALC 88 07/03/2013   ALT 28 12/02/2013   AST 17 12/02/2013   NA 136* 12/02/2013   K 4.0 12/02/2013   CL 102 12/02/2013   CREATININE 1.04 12/02/2013   BUN 18 12/02/2013   CO2 23 12/02/2013   TSH 1.74 07/03/2013   PSA 1.17 07/03/2013   HGBA1C 5.2 06/16/2012       Assessment & Plan:

## 2014-08-12 NOTE — Assessment & Plan Note (Signed)
I will check his A1C to see if he has developed DM2 

## 2014-08-12 NOTE — Patient Instructions (Signed)
Health Maintenance A healthy lifestyle and preventative care can promote health and wellness.  Maintain regular health, dental, and eye exams.  Eat a healthy diet. Foods like vegetables, fruits, whole grains, low-fat dairy products, and lean protein foods contain the nutrients you need and are low in calories. Decrease your intake of foods high in solid fats, added sugars, and salt. Get information about a proper diet from your health care provider, if necessary.  Regular physical exercise is one of the most important things you can do for your health. Most adults should get at least 150 minutes of moderate-intensity exercise (any activity that increases your heart rate and causes you to sweat) each week. In addition, most adults need muscle-strengthening exercises on 2 or more days a week.   Maintain a healthy weight. The body mass index (BMI) is a screening tool to identify possible weight problems. It provides an estimate of body fat based on height and weight. Your health care provider can find your BMI and can help you achieve or maintain a healthy weight. For males 20 years and older:  A BMI below 18.5 is considered underweight.  A BMI of 18.5 to 24.9 is normal.  A BMI of 25 to 29.9 is considered overweight.  A BMI of 30 and above is considered obese.  Maintain normal blood lipids and cholesterol by exercising and minimizing your intake of saturated fat. Eat a balanced diet with plenty of fruits and vegetables. Blood tests for lipids and cholesterol should begin at age 20 and be repeated every 5 years. If your lipid or cholesterol levels are high, you are over age 50, or you are at high risk for heart disease, you may need your cholesterol levels checked more frequently.Ongoing high lipid and cholesterol levels should be treated with medicines if diet and exercise are not working.  If you smoke, find out from your health care provider how to quit. If you do not use tobacco, do not  start.  Lung cancer screening is recommended for adults aged 55-80 years who are at high risk for developing lung cancer because of a history of smoking. A yearly low-dose CT scan of the lungs is recommended for people who have at least a 30-pack-year history of smoking and are current smokers or have quit within the past 15 years. A pack year of smoking is smoking an average of 1 pack of cigarettes a day for 1 year (for example, a 30-pack-year history of smoking could mean smoking 1 pack a day for 30 years or 2 packs a day for 15 years). Yearly screening should continue until the smoker has stopped smoking for at least 15 years. Yearly screening should be stopped for people who develop a health problem that would prevent them from having lung cancer treatment.  If you choose to drink alcohol, do not have more than 2 drinks per day. One drink is considered to be 12 oz (360 mL) of beer, 5 oz (150 mL) of wine, or 1.5 oz (45 mL) of liquor.  Avoid the use of street drugs. Do not share needles with anyone. Ask for help if you need support or instructions about stopping the use of drugs.  High blood pressure causes heart disease and increases the risk of stroke. Blood pressure should be checked at least every 1-2 years. Ongoing high blood pressure should be treated with medicines if weight loss and exercise are not effective.  If you are 45-79 years old, ask your health care provider if   you should take aspirin to prevent heart disease.  Diabetes screening involves taking a blood sample to check your fasting blood sugar level. This should be done once every 3 years after age 45 if you are at a normal weight and without risk factors for diabetes. Testing should be considered at a younger age or be carried out more frequently if you are overweight and have at least 1 risk factor for diabetes.  Colorectal cancer can be detected and often prevented. Most routine colorectal cancer screening begins at the age of 50  and continues through age 75. However, your health care provider may recommend screening at an earlier age if you have risk factors for colon cancer. On a yearly basis, your health care provider may provide home test kits to check for hidden blood in the stool. A small camera at the end of a tube may be used to directly examine the colon (sigmoidoscopy or colonoscopy) to detect the earliest forms of colorectal cancer. Talk to your health care provider about this at age 50 when routine screening begins. A direct exam of the colon should be repeated every 5-10 years through age 75, unless early forms of precancerous polyps or small growths are found.  People who are at an increased risk for hepatitis B should be screened for this virus. You are considered at high risk for hepatitis B if:  You were born in a country where hepatitis B occurs often. Talk with your health care provider about which countries are considered high risk.  Your parents were born in a high-risk country and you have not received a shot to protect against hepatitis B (hepatitis B vaccine).  You have HIV or AIDS.  You use needles to inject street drugs.  You live with, or have sex with, someone who has hepatitis B.  You are a man who has sex with other men (MSM).  You get hemodialysis treatment.  You take certain medicines for conditions like cancer, organ transplantation, and autoimmune conditions.  Hepatitis C blood testing is recommended for all people born from 1945 through 1965 and any individual with known risk factors for hepatitis C.  Healthy men should no longer receive prostate-specific antigen (PSA) blood tests as part of routine cancer screening. Talk to your health care provider about prostate cancer screening.  Testicular cancer screening is not recommended for adolescents or adult males who have no symptoms. Screening includes self-exam, a health care provider exam, and other screening tests. Consult with your  health care provider about any symptoms you have or any concerns you have about testicular cancer.  Practice safe sex. Use condoms and avoid high-risk sexual practices to reduce the spread of sexually transmitted infections (STIs).  You should be screened for STIs, including gonorrhea and chlamydia if:  You are sexually active and are younger than 24 years.  You are older than 24 years, and your health care provider tells you that you are at risk for this type of infection.  Your sexual activity has changed since you were last screened, and you are at an increased risk for chlamydia or gonorrhea. Ask your health care provider if you are at risk.  If you are at risk of being infected with HIV, it is recommended that you take a prescription medicine daily to prevent HIV infection. This is called pre-exposure prophylaxis (PrEP). You are considered at risk if:  You are a man who has sex with other men (MSM).  You are a heterosexual man who   is sexually active with multiple partners.  You take drugs by injection.  You are sexually active with a partner who has HIV.  Talk with your health care provider about whether you are at high risk of being infected with HIV. If you choose to begin PrEP, you should first be tested for HIV. You should then be tested every 3 months for as long as you are taking PrEP.  Use sunscreen. Apply sunscreen liberally and repeatedly throughout the day. You should seek shade when your shadow is shorter than you. Protect yourself by wearing long sleeves, pants, a wide-brimmed hat, and sunglasses year round whenever you are outdoors.  Tell your health care provider of new moles or changes in moles, especially if there is a change in shape or color. Also, tell your health care provider if a mole is larger than the size of a pencil eraser.  A one-time screening for abdominal aortic aneurysm (AAA) and surgical repair of large AAAs by ultrasound is recommended for men aged  65-75 years who are current or former smokers.  Stay current with your vaccines (immunizations). Document Released: 04/08/2008 Document Revised: 10/16/2013 Document Reviewed: 03/08/2011 Exit[employer] Patient Information 2015 [employer]. This information is not intended to replace advice given to you by your health care provider. Make sure you discuss any questions you have with your health care provider.  Hypertension Hypertension, commonly called high blood pressure, is when the force of blood pumping through your arteries is too strong. Your arteries are the blood vessels that carry blood from your heart throughout your body. A blood pressure reading consists of a higher number over a lower number, such as 110/72. The higher number (systolic) is the pressure inside your arteries when your heart pumps. The lower number (diastolic) is the pressure inside your arteries when your heart relaxes. Ideally you want your blood pressure below 120/80. Hypertension forces your heart to work harder to pump blood. Your arteries may become narrow or stiff. Having hypertension puts you at risk for heart disease, stroke, and other problems.  RISK FACTORS Some risk factors for high blood pressure are controllable. Others are not.  Risk factors you cannot control include:   Race. You may be at higher risk if you are African American.  Age. Risk increases with age.  Gender. Men are at higher risk than women before age 45 years. After age 65, women are at higher risk than men. Risk factors you can control include:  Not getting enough exercise or physical activity.  Being overweight.  Getting too much fat, sugar, calories, or salt in your diet.  Drinking too much alcohol. SIGNS AND SYMPTOMS Hypertension does not usually cause signs or symptoms. Extremely high blood pressure (hypertensive crisis) may cause headache, anxiety, shortness of breath, and nosebleed. DIAGNOSIS  To check if you have hypertension,  your health care provider will measure your blood pressure while you are seated, with your arm held at the level of your heart. It should be measured at least twice using the same arm. Certain conditions can cause a difference in blood pressure between your right and left arms. A blood pressure reading that is higher than normal on one occasion does not mean that you need treatment. If one blood pressure reading is high, ask your health care provider about having it checked again. TREATMENT  Treating high blood pressure includes making lifestyle changes and possibly taking medicine. Living a healthy lifestyle can help lower high blood pressure. You may need to change   some of your habits. Lifestyle changes may include:  Following the DASH diet. This diet is high in fruits, vegetables, and whole grains. It is low in salt, red meat, and added sugars.  Getting at least 2 hours of brisk physical activity every week.  Losing weight if necessary.  Not smoking.  Limiting alcoholic beverages.  Learning ways to reduce stress. If lifestyle changes are not enough to get your blood pressure under control, your health care provider may prescribe medicine. You may need to take more than one. Work closely with your health care provider to understand the risks and benefits. HOME CARE INSTRUCTIONS  Have your blood pressure rechecked as directed by your health care provider.   Take medicines only as directed by your health care provider. Follow the directions carefully. Blood pressure medicines must be taken as prescribed. The medicine does not work as well when you skip doses. Skipping doses also puts you at risk for problems.   Do not smoke.   Monitor your blood pressure at home as directed by your health care provider. SEEK MEDICAL CARE IF:   You think you are having a reaction to medicines taken.  You have recurrent headaches or feel dizzy.  You have swelling in your ankles.  You have  trouble with your vision. SEEK IMMEDIATE MEDICAL CARE IF:  You develop a severe headache or confusion.  You have unusual weakness, numbness, or feel faint.  You have severe chest or abdominal pain.  You vomit repeatedly.  You have trouble breathing. MAKE SURE YOU:   Understand these instructions.  Will watch your condition.  Will get help right away if you are not doing well or get worse. Document Released: 10/11/2005 Document Revised: 02/25/2014 Document Reviewed: 08/03/2013 Exit[employer] Patient Information 2015 [employer]. This information is not intended to replace advice given to you by your health care provider. Make sure you discuss any questions you have with your health care provider.  

## 2014-08-12 NOTE — Assessment & Plan Note (Signed)
Exam done Vaccines were reviewed, he refused a flu vax Labs ordered Pt ed material was given

## 2014-08-12 NOTE — Progress Notes (Signed)
Pre visit review using our clinic review tool, if applicable. No additional management support is needed unless otherwise documented below in the visit note. 

## 2014-08-12 NOTE — Assessment & Plan Note (Signed)
His BP is well controlled His lytes and renal function are stable 

## 2014-08-13 ENCOUNTER — Encounter: Payer: Self-pay | Admitting: Internal Medicine

## 2014-08-14 ENCOUNTER — Telehealth: Payer: Self-pay | Admitting: Internal Medicine

## 2014-08-14 DIAGNOSIS — M5106 Intervertebral disc disorders with myelopathy, lumbar region: Secondary | ICD-10-CM

## 2014-08-14 NOTE — Telephone Encounter (Signed)
Pt requesting [facility].

## 2014-08-14 NOTE — Telephone Encounter (Signed)
done

## 2014-08-29 ENCOUNTER — Telehealth: Payer: Self-pay | Admitting: Internal Medicine

## 2014-08-29 MED ORDER — LISINOPRIL 40 MG PO TABS: ORAL_TABLET | ORAL | Status: DC

## 2014-08-29 NOTE — Telephone Encounter (Signed)
Pt needs Lisinopril refilled, [facility], [address]. Pt was seen in the office a few weeks ago. Pt has 1 pill left.

## 2015-01-12 ENCOUNTER — Other Ambulatory Visit: Payer: Self-pay | Admitting: Internal Medicine

## 2015-01-13 ENCOUNTER — Other Ambulatory Visit: Payer: Self-pay

## 2015-01-13 MED ORDER — OMEPRAZOLE 20 MG PO CPDR: 20.0000 mg | DELAYED_RELEASE_CAPSULE | Freq: Every day | ORAL | Status: DC

## 2015-02-17 ENCOUNTER — Other Ambulatory Visit: Payer: Self-pay | Admitting: Internal Medicine

## 2015-03-30 ENCOUNTER — Other Ambulatory Visit: Payer: Self-pay | Admitting: Internal Medicine

## 2015-05-23 ENCOUNTER — Other Ambulatory Visit: Payer: Self-pay | Admitting: Internal Medicine

## 2015-06-25 ENCOUNTER — Other Ambulatory Visit: Payer: Self-pay | Admitting: Internal Medicine

## 2015-06-26 ENCOUNTER — Other Ambulatory Visit: Payer: Self-pay

## 2015-06-26 MED ORDER — OMEPRAZOLE 20 MG PO CPDR: 20.0000 mg | DELAYED_RELEASE_CAPSULE | Freq: Every day | ORAL | Status: DC

## 2015-06-26 NOTE — Telephone Encounter (Signed)
Last OV 08/12/14. Ok to rf?

## 2015-07-22 ENCOUNTER — Encounter: Payer: Self-pay | Admitting: Internal Medicine

## 2015-07-22 ENCOUNTER — Other Ambulatory Visit (INDEPENDENT_AMBULATORY_CARE_PROVIDER_SITE_OTHER): Payer: 59

## 2015-07-22 ENCOUNTER — Ambulatory Visit (INDEPENDENT_AMBULATORY_CARE_PROVIDER_SITE_OTHER): Payer: 59 | Admitting: Internal Medicine

## 2015-07-22 VITALS — BP 120/80 | HR 60 | Temp 98.3°F | Resp 16 | Ht 67.0 in | Wt 204.0 lb

## 2015-07-22 DIAGNOSIS — D126 Benign neoplasm of colon, unspecified: Secondary | ICD-10-CM | POA: Insufficient documentation

## 2015-07-22 DIAGNOSIS — Z0000 Encounter for general adult medical examination without abnormal findings: Secondary | ICD-10-CM | POA: Diagnosis not present

## 2015-07-22 DIAGNOSIS — Z1211 Encounter for screening for malignant neoplasm of colon: Secondary | ICD-10-CM

## 2015-07-22 DIAGNOSIS — R739 Hyperglycemia, unspecified: Secondary | ICD-10-CM

## 2015-07-22 LAB — LIPID PANEL
CHOL/HDL RATIO: 7
Cholesterol: 132 mg/dL (ref 0–200)
HDL: 18.5 mg/dL — ABNORMAL LOW (ref >39.00)
LDL Cholesterol: 81 mg/dL (ref 0–99)
NONHDL: 113.95
Triglycerides: 165 mg/dL — ABNORMAL HIGH (ref 0.0–149.0)
VLDL: 33 mg/dL (ref 0.0–40.0)

## 2015-07-22 LAB — CBC WITH DIFFERENTIAL/PLATELET
BASOS PCT: 0.5 % (ref 0.0–3.0)
Basophils Absolute: 0 10*3/uL (ref 0.0–0.1)
EOS PCT: 2.8 % (ref 0.0–5.0)
Eosinophils Absolute: 0.2 10*3/uL (ref 0.0–0.7)
HCT: 47.5 % (ref 39.0–52.0)
HEMOGLOBIN: 16.2 g/dL (ref 13.0–17.0)
LYMPHS ABS: 2.1 10*3/uL (ref 0.7–4.0)
Lymphocytes Relative: 28.4 % (ref 12.0–46.0)
MCHC: 34.1 g/dL (ref 30.0–36.0)
MCV: 84.8 fl (ref 78.0–100.0)
MONO ABS: 0.6 10*3/uL (ref 0.1–1.0)
MONOS PCT: 8.4 % (ref 3.0–12.0)
Neutro Abs: 4.4 10*3/uL (ref 1.4–7.7)
Neutrophils Relative %: 59.9 % (ref 43.0–77.0)
Platelets: 218 10*3/uL (ref 150.0–400.0)
RBC: 5.61 Mil/uL (ref 4.22–5.81)
RDW: 13.1 % (ref 11.5–15.5)
WBC: 7.4 10*3/uL (ref 4.0–10.5)

## 2015-07-22 LAB — COMPREHENSIVE METABOLIC PANEL
ALT: 28 U/L (ref 0–53)
AST: 19 U/L (ref 0–37)
Albumin: 4.3 g/dL (ref 3.5–5.2)
Alkaline Phosphatase: 67 U/L (ref 39–117)
BUN: 12 mg/dL (ref 6–23)
CHLORIDE: 104 meq/L (ref 96–112)
CO2: 32 mEq/L (ref 19–32)
Calcium: 9.5 mg/dL (ref 8.4–10.5)
Creatinine, Ser: 1.14 mg/dL (ref 0.40–1.50)
GFR: 70.3 mL/min (ref >60.00)
GLUCOSE: 91 mg/dL (ref 70–99)
POTASSIUM: 4.4 meq/L (ref 3.5–5.1)
SODIUM: 140 meq/L (ref 135–145)
Total Bilirubin: 0.8 mg/dL (ref 0.2–1.2)
Total Protein: 6.4 g/dL (ref 6.0–8.3)

## 2015-07-22 LAB — TSH: TSH: 1.3 u[IU]/mL (ref 0.35–4.50)

## 2015-07-22 LAB — PSA: PSA: 0.92 ng/mL (ref 0.10–4.00)

## 2015-07-22 LAB — FECAL OCCULT BLOOD, GUAIAC: FECAL OCCULT BLD: NEGATIVE

## 2015-07-22 NOTE — Patient Instructions (Signed)
Health Maintenance A healthy lifestyle and preventative care can promote health and wellness.  Maintain regular health, dental, and eye exams.  Eat a healthy diet. Foods like vegetables, fruits, whole grains, low-fat dairy products, and lean protein foods contain the nutrients you need and are low in calories. Decrease your intake of foods high in solid fats, added sugars, and salt. Get information about a proper diet from your health care Kahley Leib, if necessary.  Regular physical exercise is one of the most important things you can do for your health. Most adults should get at least 150 minutes of moderate-intensity exercise (any activity that increases your heart rate and causes you to sweat) each week. In addition, most adults need muscle-strengthening exercises on 2 or more days a week.   Maintain a healthy weight. The body mass index (BMI) is a screening tool to identify possible weight problems. It provides an estimate of body fat based on height and weight. Your health care Jady Braggs can find your BMI and can help you achieve or maintain a healthy weight. For males 20 years and older:  A BMI below 18.5 is considered underweight.  A BMI of 18.5 to 24.9 is normal.  A BMI of 25 to 29.9 is considered overweight.  A BMI of 30 and above is considered obese.  Maintain normal blood lipids and cholesterol by exercising and minimizing your intake of saturated fat. Eat a balanced diet with plenty of fruits and vegetables. Blood tests for lipids and cholesterol should begin at age 20 and be repeated every 5 years. If your lipid or cholesterol levels are high, you are over age 50, or you are at high risk for heart disease, you may need your cholesterol levels checked more frequently.Ongoing high lipid and cholesterol levels should be treated with medicines if diet and exercise are not working.  If you smoke, find out from your health care Rylen Swindler how to quit. If you do not use tobacco, do not  start.  Lung cancer screening is recommended for adults aged 55-80 years who are at high risk for developing lung cancer because of a history of smoking. A yearly low-dose CT scan of the lungs is recommended for people who have at least a 30-pack-year history of smoking and are current smokers or have quit within the past 15 years. A pack year of smoking is smoking an average of 1 pack of cigarettes a day for 1 year (for example, a 30-pack-year history of smoking could mean smoking 1 pack a day for 30 years or 2 packs a day for 15 years). Yearly screening should continue until the smoker has stopped smoking for at least 15 years. Yearly screening should be stopped for people who develop a health problem that would prevent them from having lung cancer treatment.  If you choose to drink alcohol, do not have more than 2 drinks per day. One drink is considered to be 12 oz (360 mL) of beer, 5 oz (150 mL) of wine, or 1.5 oz (45 mL) of liquor.  Avoid the use of street drugs. Do not share needles with anyone. Ask for help if you need support or instructions about stopping the use of drugs.  High blood pressure causes heart disease and increases the risk of stroke. Blood pressure should be checked at least every 1-2 years. Ongoing high blood pressure should be treated with medicines if weight loss and exercise are not effective.  If you are 45-79 years old, ask your health care Ellenor Wisniewski if   you should take aspirin to prevent heart disease.  Diabetes screening involves taking a blood sample to check your fasting blood sugar level. This should be done once every 3 years after age 45 if you are at a normal weight and without risk factors for diabetes. Testing should be considered at a younger age or be carried out more frequently if you are overweight and have at least 1 risk factor for diabetes.  Colorectal cancer can be detected and often prevented. Most routine colorectal cancer screening begins at the age of 50  and continues through age 75. However, your health care Matilde Pottenger may recommend screening at an earlier age if you have risk factors for colon cancer. On a yearly basis, your health care Randell Teare may provide home test kits to check for hidden blood in the stool. A small camera at the end of a tube may be used to directly examine the colon (sigmoidoscopy or colonoscopy) to detect the earliest forms of colorectal cancer. Talk to your health care Jacarri Gesner about this at age 50 when routine screening begins. A direct exam of the colon should be repeated every 5-10 years through age 75, unless early forms of precancerous polyps or small growths are found.  People who are at an increased risk for hepatitis B should be screened for this virus. You are considered at high risk for hepatitis B if:  You were born in a country where hepatitis B occurs often. Talk with your health care Ashford Clouse about which countries are considered high risk.  Your parents were born in a high-risk country and you have not received a shot to protect against hepatitis B (hepatitis B vaccine).  You have HIV or AIDS.  You use needles to inject street drugs.  You live with, or have sex with, someone who has hepatitis B.  You are a man who has sex with other men (MSM).  You get hemodialysis treatment.  You take certain medicines for conditions like cancer, organ transplantation, and autoimmune conditions.  Hepatitis C blood testing is recommended for all people born from 1945 through 1965 and any individual with known risk factors for hepatitis C.  Healthy men should no longer receive prostate-specific antigen (PSA) blood tests as part of routine cancer screening. Talk to your health care Teresa Lemmerman about prostate cancer screening.  Testicular cancer screening is not recommended for adolescents or adult males who have no symptoms. Screening includes self-exam, a health care Sherill Mangen exam, and other screening tests. Consult with your  health care Dawnetta Copenhaver about any symptoms you have or any concerns you have about testicular cancer.  Practice safe sex. Use condoms and avoid high-risk sexual practices to reduce the spread of sexually transmitted infections (STIs).  You should be screened for STIs, including gonorrhea and chlamydia if:  You are sexually active and are younger than 24 years.  You are older than 24 years, and your health care Nakisha Chai tells you that you are at risk for this type of infection.  Your sexual activity has changed since you were last screened, and you are at an increased risk for chlamydia or gonorrhea. Ask your health care Blimi Godby if you are at risk.  If you are at risk of being infected with HIV, it is recommended that you take a prescription medicine daily to prevent HIV infection. This is called pre-exposure prophylaxis (PrEP). You are considered at risk if:  You are a man who has sex with other men (MSM).  You are a heterosexual man who   is sexually active with multiple partners.  You take drugs by injection.  You are sexually active with a partner who has HIV.  Talk with your health care Sharlon Pfohl about whether you are at high risk of being infected with HIV. If you choose to begin PrEP, you should first be tested for HIV. You should then be tested every 3 months for as long as you are taking PrEP.  Use sunscreen. Apply sunscreen liberally and repeatedly throughout the day. You should seek shade when your shadow is shorter than you. Protect yourself by wearing long sleeves, pants, a wide-brimmed hat, and sunglasses year round whenever you are outdoors.  Tell your health care Kordae Buonocore of new moles or changes in moles, especially if there is a change in shape or color. Also, tell your health care Egan Sahlin if a mole is larger than the size of a pencil eraser.  A one-time screening for abdominal aortic aneurysm (AAA) and surgical repair of large AAAs by ultrasound is recommended for men aged  65-75 years who are current or former smokers.  Stay current with your vaccines (immunizations). Document Released: 04/08/2008 Document Revised: 10/16/2013 Document Reviewed: 03/08/2011 Exit[employer] Patient Information 2015 [employer]. This information is not intended to replace advice given to you by your health care Taray Normoyle. Make sure you discuss any questions you have with your health care Masiah Lewing.  

## 2015-07-22 NOTE — Progress Notes (Signed)
Pre visit review using our clinic review tool, if applicable. No additional management support is needed unless otherwise documented below in the visit note. 

## 2015-07-22 NOTE — Progress Notes (Signed)
Subjective:  Patient ID: Bradley Conner, male    DOB: Jun 04, 1956  Age: 59 y.o. MRN: [id number]  CC: Hypertension; Hyperlipidemia; and Annual Exam   HPI Bradley Conner presents for complete physical and blood pressure check. He feels well and offers no complaints.  Outpatient Prescriptions Prior to Visit  Medication Sig Dispense Refill  . lisinopril (PRINIVIL,ZESTRIL) 40 MG tablet take 1 tablet by mouth once daily 90 tablet 1  . omeprazole (PRILOSEC) 20 MG capsule Take 1 capsule (20 mg total) by mouth daily. 30 minutes before a meal 90 capsule 0  . OVER THE COUNTER MEDICATION Take 1 tablet by mouth 2 (two) times daily. Has aspirin and tylenol in it for pain    . valACYclovir (VALTREX) 1000 MG tablet take 1 tablet by mouth twice a day 60 tablet 5  . lisinopril (PRINIVIL,ZESTRIL) 40 MG tablet Take 1 tablet by mouth once a day 90 tablet 0  . ondansetron (ZOFRAN) 8 MG tablet Take 1 tablet (8 mg total) by mouth every 8 (eight) hours as needed for nausea or vomiting. 8 tablet 0   No facility-administered medications prior to visit.    ROS Review of Systems  Constitutional: Negative.  Negative for fever, chills, diaphoresis, appetite change and fatigue.  HENT: Negative.  Negative for congestion, sore throat, trouble swallowing and voice change.   Eyes: Negative.   Respiratory: Negative.  Negative for cough, choking, chest tightness, shortness of breath, wheezing and stridor.   Cardiovascular: Negative.  Negative for chest pain, palpitations and leg swelling.  Gastrointestinal: Negative.  Negative for nausea, vomiting, abdominal pain, diarrhea, constipation and blood in stool.  Endocrine: Negative.   Genitourinary: Negative.   Musculoskeletal: Negative.  Negative for myalgias.  Skin: Negative.  Negative for rash.  Allergic/Immunologic: Negative.   Neurological: Negative.  Negative for dizziness, tremors, syncope, light-headedness, numbness and headaches.  Hematological: Negative.   Negative for adenopathy. Does not bruise/bleed easily.  Psychiatric/Behavioral: Negative.     Objective:  BP 120/80 mmHg  Pulse 60  Temp(Src) 98.3 F (36.8 C) (Oral)  Resp 16  Ht 5\' 7"  (1.702 m)  Wt 204 lb (92.534 kg)  BMI 31.94 kg/m2  SpO2 95%  BP Readings from Last 3 Encounters:  07/22/15 120/80  08/12/14 130/82  12/02/13 130/80    Wt Readings from Last 3 Encounters:  07/22/15 204 lb (92.534 kg)  08/12/14 195 lb (88.451 kg)  07/05/13 200 lb (90.719 kg)    Physical Exam  Constitutional: He is oriented to person, place, and time. He appears well-developed and well-nourished. No distress.  HENT:  Head: Normocephalic and atraumatic.  Mouth/Throat: Oropharynx is clear and moist. No oropharyngeal exudate.  Eyes: Conjunctivae are normal. Right eye exhibits no discharge. Left eye exhibits no discharge. No scleral icterus.  Neck: Normal range of motion. Neck supple. No JVD present. No tracheal deviation present. No thyromegaly present.  Cardiovascular: Normal rate, regular rhythm, normal heart sounds and intact distal pulses.  Exam reveals no gallop and no friction rub.   No murmur heard. Pulmonary/Chest: Effort normal and breath sounds normal. No stridor. No respiratory distress. He has no wheezes. He has no rales. He exhibits no tenderness.  Abdominal: Soft. Bowel sounds are normal. He exhibits no distension and no mass. There is no tenderness. There is no rebound and no guarding. Hernia confirmed negative in the right inguinal area and confirmed negative in the left inguinal area.  Genitourinary: Rectum normal, prostate normal, testes normal and penis normal. Rectal exam shows  no external hemorrhoid, no internal hemorrhoid, no fissure, no mass, no tenderness and anal tone normal. Guaiac negative stool. Prostate is not enlarged and not tender. Right testis shows no mass, no swelling and no tenderness. Right testis is descended. Left testis shows no mass, no swelling and no  tenderness. Left testis is descended. Circumcised. No penile erythema or penile tenderness. No discharge found.  Musculoskeletal: Normal range of motion. He exhibits no edema or tenderness.  Lymphadenopathy:    He has no cervical adenopathy.       Right: No inguinal adenopathy present.       Left: No inguinal adenopathy present.  Neurological: He is oriented to person, place, and time.  Skin: Skin is warm and dry. No rash noted. He is not diaphoretic. No erythema. No pallor.  Psychiatric: He has a normal mood and affect. His behavior is normal. Judgment and thought content normal.  Vitals reviewed.   Lab Results  Component Value Date   WBC 7.4 07/22/2015   HGB 16.2 07/22/2015   HCT 47.5 07/22/2015   PLT 218.0 07/22/2015   GLUCOSE 91 07/22/2015   CHOL 132 07/22/2015   TRIG 165.0* 07/22/2015   HDL 18.50* 07/22/2015   LDLCALC 81 07/22/2015   ALT 28 07/22/2015   AST 19 07/22/2015   NA 140 07/22/2015   K 4.4 07/22/2015   CL 104 07/22/2015   CREATININE 1.14 07/22/2015   BUN 12 07/22/2015   CO2 32 07/22/2015   TSH 1.30 07/22/2015   PSA 0.92 07/22/2015   HGBA1C 5.4 08/12/2014    Ct Abdomen Pelvis W Contrast  12/02/2013   CLINICAL DATA:  Abdominal pain with nausea and vomiting  EXAM: CT ABDOMEN AND PELVIS WITH CONTRAST  TECHNIQUE: Multidetector CT imaging of the abdomen and pelvis was performed using the standard protocol following bolus administration of intravenous contrast.  CONTRAST:  80mL OMNIPAQUE IOHEXOL 300 MG/ML SOLN, 176mL OMNIPAQUE IOHEXOL 300 MG/ML SOLN  COMPARISON:  None.  FINDINGS: Lung bases are free of acute infiltrate or sizable effusion.  The liver, gallbladder, spleen, adrenal glands and pancreas are within normal limits. The kidneys are well visualized and demonstrate a normal enhancement pattern. A tiny cystic lesion is noted in the upper pole of the left kidney. Appendix is well visualized and within normal limits. Scattered loops of mildly dilated small bowel are  noted in the mid and right abdomen. These involve primarily the distal jejunum and entire ileum. The terminal ileum however is within normal limits. No definitive transition zone is seen. These changes the bladder is well distended. No pelvic mass lesion is seen. Mild diverticular change of the colon is noted without diverticulitis. May represent a small bowel ileus.  IMPRESSION: Dilated loops of small bowel involving the jejunum and ileum. No definitive transition zone is seen. Correlation with the physical exam is recommended as this may represent a small bowel ileus.  No other acute abnormality is seen.   Electronically Signed   By: Inez Catalina M.D.   On: 12/02/2013 11:12    Assessment & Plan:   Tania was seen today for hypertension, hyperlipidemia and annual exam.  Diagnoses and all orders for this visit:  Routine general medical examination at a health care facility- exam completed, vaccines reviewed and updated, his colonoscopy is 57 years old so he was referred for a follow-up colonoscopy, labs ordered and reviewed, he was given patient education material. -     Lipid panel; Future -     PSA; Future -  TSH; Future -     Comprehensive metabolic panel; Future -     CBC with Differential/Platelet; Future  Hyperglycemia  Screen for colon cancer -     Ambulatory referral to Gastroenterology   I have discontinued Mr. Oliver's ondansetron. I am also having him maintain his OVER THE COUNTER MEDICATION, lisinopril, valACYclovir, and omeprazole.  No orders of the defined types were placed in this encounter.     Follow-up: Return in about 6 months (around 01/19/2016).  Scarlette Calico, MD

## 2015-07-25 ENCOUNTER — Other Ambulatory Visit: Payer: Self-pay | Admitting: Internal Medicine

## 2015-09-02 LAB — HM COLONOSCOPY

## 2015-11-13 ENCOUNTER — Other Ambulatory Visit: Payer: Self-pay | Admitting: Internal Medicine

## 2015-11-14 ENCOUNTER — Other Ambulatory Visit: Payer: Self-pay

## 2015-11-14 MED ORDER — VALACYCLOVIR HCL 1 G PO TABS: 1000.0000 mg | ORAL_TABLET | Freq: Two times a day (BID) | ORAL | Status: DC

## 2016-05-27 ENCOUNTER — Other Ambulatory Visit: Payer: Self-pay | Admitting: Internal Medicine

## 2016-05-31 ENCOUNTER — Other Ambulatory Visit (INDEPENDENT_AMBULATORY_CARE_PROVIDER_SITE_OTHER): Payer: 59

## 2016-05-31 ENCOUNTER — Ambulatory Visit (INDEPENDENT_AMBULATORY_CARE_PROVIDER_SITE_OTHER): Payer: 59 | Admitting: Internal Medicine

## 2016-05-31 ENCOUNTER — Encounter: Payer: Self-pay | Admitting: Internal Medicine

## 2016-05-31 VITALS — BP 130/90 | HR 57 | Temp 98.9°F | Ht 67.0 in | Wt 210.2 lb

## 2016-05-31 DIAGNOSIS — K219 Gastro-esophageal reflux disease without esophagitis: Secondary | ICD-10-CM | POA: Diagnosis not present

## 2016-05-31 DIAGNOSIS — I10 Essential (primary) hypertension: Secondary | ICD-10-CM | POA: Diagnosis not present

## 2016-05-31 LAB — BASIC METABOLIC PANEL
BUN: 13 mg/dL (ref 6–23)
CO2: 26 mEq/L (ref 19–32)
Calcium: 9.3 mg/dL (ref 8.4–10.5)
Chloride: 105 mEq/L (ref 96–112)
Creatinine, Ser: 1.06 mg/dL (ref 0.40–1.50)
GFR: 76.22 mL/min (ref >60.00)
GLUCOSE: 99 mg/dL (ref 70–99)
POTASSIUM: 3.9 meq/L (ref 3.5–5.1)
Sodium: 137 mEq/L (ref 135–145)

## 2016-05-31 LAB — CBC WITH DIFFERENTIAL/PLATELET
BASOS PCT: 0.4 % (ref 0.0–3.0)
Basophils Absolute: 0 10*3/uL (ref 0.0–0.1)
EOS ABS: 0.2 10*3/uL (ref 0.0–0.7)
EOS PCT: 2.5 % (ref 0.0–5.0)
HEMATOCRIT: 46.4 % (ref 39.0–52.0)
HEMOGLOBIN: 16.1 g/dL (ref 13.0–17.0)
LYMPHS PCT: 26.7 % (ref 12.0–46.0)
Lymphs Abs: 2.2 10*3/uL (ref 0.7–4.0)
MCHC: 34.6 g/dL (ref 30.0–36.0)
MCV: 84.7 fl (ref 78.0–100.0)
MONOS PCT: 10.1 % (ref 3.0–12.0)
Monocytes Absolute: 0.8 10*3/uL (ref 0.1–1.0)
Neutro Abs: 5.1 10*3/uL (ref 1.4–7.7)
Neutrophils Relative %: 60.3 % (ref 43.0–77.0)
Platelets: 227 10*3/uL (ref 150.0–400.0)
RBC: 5.48 Mil/uL (ref 4.22–5.81)
RDW: 13.3 % (ref 11.5–15.5)
WBC: 8.4 10*3/uL (ref 4.0–10.5)

## 2016-05-31 MED ORDER — LISINOPRIL 40 MG PO TABS: 40.0000 mg | ORAL_TABLET | Freq: Every day | ORAL | 1 refills | Status: DC

## 2016-05-31 NOTE — Progress Notes (Signed)
Pre visit review using our clinic review tool, if applicable. No additional management support is needed unless otherwise documented below in the visit note. 

## 2016-05-31 NOTE — Patient Instructions (Signed)
Hypertension Hypertension, commonly called high blood pressure, is when the force of blood pumping through your arteries is too strong. Your arteries are the blood vessels that carry blood from your heart throughout your body. A blood pressure reading consists of a higher number over a lower number, such as 110/72. The higher number (systolic) is the pressure inside your arteries when your heart pumps. The lower number (diastolic) is the pressure inside your arteries when your heart relaxes. Ideally you want your blood pressure below 120/80. Hypertension forces your heart to work harder to pump blood. Your arteries may become narrow or stiff. Having untreated or uncontrolled hypertension can cause heart attack, stroke, kidney disease, and other problems. RISK FACTORS Some risk factors for high blood pressure are controllable. Others are not.  Risk factors you cannot control include:   Race. You may be at higher risk if you are African American.  Age. Risk increases with age.  Gender. Men are at higher risk than women before age 45 years. After age 65, women are at higher risk than men. Risk factors you can control include:  Not getting enough exercise or physical activity.  Being overweight.  Getting too much fat, sugar, calories, or salt in your diet.  Drinking too much alcohol. SIGNS AND SYMPTOMS Hypertension does not usually cause signs or symptoms. Extremely high blood pressure (hypertensive crisis) may cause headache, anxiety, shortness of breath, and nosebleed. DIAGNOSIS To check if you have hypertension, your health care provider will measure your blood pressure while you are seated, with your arm held at the level of your heart. It should be measured at least twice using the same arm. Certain conditions can cause a difference in blood pressure between your right and left arms. A blood pressure reading that is higher than normal on one occasion does not mean that you need treatment. If  it is not clear whether you have high blood pressure, you may be asked to return on a different day to have your blood pressure checked again. Or, you may be asked to monitor your blood pressure at home for 1 or more weeks. TREATMENT Treating high blood pressure includes making lifestyle changes and possibly taking medicine. Living a healthy lifestyle can help lower high blood pressure. You may need to change some of your habits. Lifestyle changes may include:  Following the DASH diet. This diet is high in fruits, vegetables, and whole grains. It is low in salt, red meat, and added sugars.  Keep your sodium intake below 2,300 mg per day.  Getting at least 30-45 minutes of aerobic exercise at least 4 times per week.  Losing weight if necessary.  Not smoking.  Limiting alcoholic beverages.  Learning ways to reduce stress. Your health care provider may prescribe medicine if lifestyle changes are not enough to get your blood pressure under control, and if one of the following is true:  You are 18-59 years of age and your systolic blood pressure is above 140.  You are 60 years of age or older, and your systolic blood pressure is above 150.  Your diastolic blood pressure is above 90.  You have diabetes, and your systolic blood pressure is over 140 or your diastolic blood pressure is over 90.  You have kidney disease and your blood pressure is above 140/90.  You have heart disease and your blood pressure is above 140/90. Your personal target blood pressure may vary depending on your medical conditions, your age, and other factors. HOME CARE INSTRUCTIONS    Have your blood pressure rechecked as directed by your health care provider.   Take medicines only as directed by your health care provider. Follow the directions carefully. Blood pressure medicines must be taken as prescribed. The medicine does not work as well when you skip doses. Skipping doses also puts you at risk for  problems.  Do not smoke.   Monitor your blood pressure at home as directed by your health care provider. SEEK MEDICAL CARE IF:   You think you are having a reaction to medicines taken.  You have recurrent headaches or feel dizzy.  You have swelling in your ankles.  You have trouble with your vision. SEEK IMMEDIATE MEDICAL CARE IF:  You develop a severe headache or confusion.  You have unusual weakness, numbness, or feel faint.  You have severe chest or abdominal pain.  You vomit repeatedly.  You have trouble breathing. MAKE SURE YOU:   Understand these instructions.  Will watch your condition.  Will get help right away if you are not doing well or get worse.   This information is not intended to replace advice given to you by your health care provider. Make sure you discuss any questions you have with your health care provider.   Document Released: 10/11/2005 Document Revised: 02/25/2015 Document Reviewed: 08/03/2013 Elsevier Interactive Patient Education 2016 [employer]  

## 2016-05-31 NOTE — Progress Notes (Signed)
Subjective:  Patient ID: Bradley Conner, male    DOB: [birth year]  Age: 60 y.o. MRN: [id number]  CC: Hypertension   HPI Bradley Conner [id number] presents for a BP check, he feels well and offers no complaints. His BP at home has been very well controlled.  Outpatient Medications Prior to Visit  Medication Sig Dispense Refill  . omeprazole (PRILOSEC) 20 MG capsule Take 1 capsule by mouth  daily 30 minutes before a  meal 90 capsule 3  . lisinopril (PRINIVIL,ZESTRIL) 40 MG tablet Take 1 tablet by mouth once a day 90 tablet 1  . valACYclovir (VALTREX) 1000 MG tablet Take 1 tablet (1,000 mg total) by mouth 2 (two) times daily. (Patient not taking: Reported on 05/31/2016) 60 tablet 1  . OVER THE COUNTER MEDICATION Take 1 tablet by mouth 2 (two) times daily. Has aspirin and tylenol in it for pain     No facility-administered medications prior to visit.     ROS Review of Systems  Constitutional: Negative.  Negative for appetite change, diaphoresis, fatigue and unexpected weight change.  HENT: Negative.   Eyes: Negative.  Negative for visual disturbance.  Respiratory: Negative.  Negative for cough, chest tightness, shortness of breath and stridor.   Cardiovascular: Negative.  Negative for chest pain, palpitations and leg swelling.  Gastrointestinal: Negative.  Negative for abdominal pain, constipation, diarrhea, nausea and vomiting.  Genitourinary: Negative.  Negative for dysuria.  Musculoskeletal: Negative.  Negative for back pain and neck pain.  Skin: Negative.  Negative for rash.  Allergic/Immunologic: Negative.   Neurological: Negative.  Negative for dizziness, weakness and headaches.  Hematological: Negative.  Negative for adenopathy. Does not bruise/bleed easily.  Psychiatric/Behavioral: Negative.     Objective:  BP 130/90 (BP Location: Left Arm, Patient Position: Sitting, Cuff Size: Large)   Pulse (!) 57   Temp 98.9 F (37.2 C) (Oral)   Ht 5\' 7"  (1.702 m)   Wt 210 lb 4 oz (95.4 kg)    SpO2 97%   BMI 32.93 kg/m   BP Readings from Last 3 Encounters:  05/31/16 130/90  07/22/15 120/80  08/12/14 130/82    Wt Readings from Last 3 Encounters:  05/31/16 210 lb 4 oz (95.4 kg)  07/22/15 204 lb (92.5 kg)  08/12/14 195 lb (88.5 kg)    Physical Exam  Constitutional: He is oriented to person, place, and time. No distress.  HENT:  Mouth/Throat: Oropharynx is clear and moist.  Eyes: Conjunctivae are normal. Right eye exhibits no discharge. Left eye exhibits no discharge. No scleral icterus.  Neck: Normal range of motion. Neck supple. No JVD present. No tracheal deviation present. No thyromegaly present.  Cardiovascular: Normal rate, regular rhythm, normal heart sounds and intact distal pulses.  Exam reveals no gallop and no friction rub.   No murmur heard. Pulmonary/Chest: Effort normal and breath sounds normal. No stridor. No respiratory distress. He has no wheezes. He has no rales. He exhibits no tenderness.  Abdominal: Soft. Bowel sounds are normal. He exhibits no distension and no mass. There is no tenderness. There is no rebound and no guarding.  Musculoskeletal: Normal range of motion. He exhibits no edema, tenderness or deformity.  Lymphadenopathy:    He has no cervical adenopathy.  Neurological: He is oriented to person, place, and time.  Skin: Skin is warm and dry. No rash noted. He is not diaphoretic. No erythema. No pallor.  Vitals reviewed.   Lab Results  Component Value Date   WBC 8.4 05/31/2016  HGB 16.1 05/31/2016   HCT 46.4 05/31/2016   PLT 227.0 05/31/2016   GLUCOSE 99 05/31/2016   CHOL 132 07/22/2015   TRIG 165.0 (H) 07/22/2015   HDL 18.50 (L) 07/22/2015   LDLCALC 81 07/22/2015   ALT 28 07/22/2015   AST 19 07/22/2015   NA 137 05/31/2016   K 3.9 05/31/2016   CL 105 05/31/2016   CREATININE 1.06 05/31/2016   BUN 13 05/31/2016   CO2 26 05/31/2016   TSH 1.30 07/22/2015   PSA 0.92 07/22/2015   HGBA1C 5.4 08/12/2014    Ct Abdomen Pelvis W  Contrast  Result Date: 12/02/2013 CLINICAL DATA:  Abdominal pain with nausea and vomiting EXAM: CT ABDOMEN AND PELVIS WITH CONTRAST TECHNIQUE: Multidetector CT imaging of the abdomen and pelvis was performed using the standard protocol following bolus administration of intravenous contrast. CONTRAST:  27mL OMNIPAQUE IOHEXOL 300 MG/ML SOLN, 166mL OMNIPAQUE IOHEXOL 300 MG/ML SOLN COMPARISON:  None. FINDINGS: Lung bases are free of acute infiltrate or sizable effusion. The liver, gallbladder, spleen, adrenal glands and pancreas are within normal limits. The kidneys are well visualized and demonstrate a normal enhancement pattern. A tiny cystic lesion is noted in the upper pole of the left kidney. Appendix is well visualized and within normal limits. Scattered loops of mildly dilated small bowel are noted in the mid and right abdomen. These involve primarily the distal jejunum and entire ileum. The terminal ileum however is within normal limits. No definitive transition zone is seen. These changes the bladder is well distended. No pelvic mass lesion is seen. Mild diverticular change of the colon is noted without diverticulitis. May represent a small bowel ileus. IMPRESSION: Dilated loops of small bowel involving the jejunum and ileum. No definitive transition zone is seen. Correlation with the physical exam is recommended as this may represent a small bowel ileus. No other acute abnormality is seen. Electronically Signed   By: Inez Catalina M.D.   On: 12/02/2013 11:12    Assessment & Plan:   Bradley Conner was seen today for hypertension.  Diagnoses and all orders for this visit:  Essential hypertension- his BP is well controlled, lytes and renal function are stable -     lisinopril (PRINIVIL,ZESTRIL) 40 MG tablet; Take 1 tablet (40 mg total) by mouth daily. -     CBC with Differential/Platelet; Future -     Basic metabolic panel; Future  Gastroesophageal reflux disease without esophagitis- s/s are well controlled  with a PPI -     CBC with Differential/Platelet; Future   I have discontinued Bradley Conner's OVER THE COUNTER MEDICATION. I have also changed his lisinopril. Additionally, I am having him maintain his omeprazole and valACYclovir.  Meds ordered this encounter  Medications  . lisinopril (PRINIVIL,ZESTRIL) 40 MG tablet    Sig: Take 1 tablet (40 mg total) by mouth daily.    Dispense:  90 tablet    Refill:  1     Follow-up: Return in about 3 months (around 08/31/2016).  Scarlette Calico, MD

## 2016-12-06 ENCOUNTER — Other Ambulatory Visit: Payer: Self-pay | Admitting: Internal Medicine

## 2016-12-06 DIAGNOSIS — I10 Essential (primary) hypertension: Secondary | ICD-10-CM

## 2017-01-29 ENCOUNTER — Other Ambulatory Visit: Payer: Self-pay | Admitting: Internal Medicine

## 2017-01-29 DIAGNOSIS — I10 Essential (primary) hypertension: Secondary | ICD-10-CM

## 2017-02-14 ENCOUNTER — Other Ambulatory Visit: Payer: Self-pay | Admitting: Internal Medicine

## 2017-03-07 ENCOUNTER — Other Ambulatory Visit: Payer: Self-pay | Admitting: Internal Medicine

## 2017-03-23 ENCOUNTER — Encounter: Payer: Self-pay | Admitting: Internal Medicine

## 2017-03-23 ENCOUNTER — Other Ambulatory Visit (INDEPENDENT_AMBULATORY_CARE_PROVIDER_SITE_OTHER): Payer: 59

## 2017-03-23 ENCOUNTER — Ambulatory Visit (INDEPENDENT_AMBULATORY_CARE_PROVIDER_SITE_OTHER): Payer: 59 | Admitting: Internal Medicine

## 2017-03-23 VITALS — BP 128/80 | HR 79 | Temp 98.1°F | Ht 67.0 in | Wt 202.2 lb

## 2017-03-23 DIAGNOSIS — Z0000 Encounter for general adult medical examination without abnormal findings: Secondary | ICD-10-CM

## 2017-03-23 DIAGNOSIS — R7989 Other specified abnormal findings of blood chemistry: Secondary | ICD-10-CM

## 2017-03-23 DIAGNOSIS — I10 Essential (primary) hypertension: Secondary | ICD-10-CM | POA: Diagnosis not present

## 2017-03-23 LAB — LIPID PANEL
Cholesterol: 136 mg/dL (ref 0–200)
HDL: 17.6 mg/dL — ABNORMAL LOW (ref >39.00)
NONHDL: 118.17
TRIGLYCERIDES: 216 mg/dL — AB (ref 0.0–149.0)
Total CHOL/HDL Ratio: 8
VLDL: 43.2 mg/dL — AB (ref 0.0–40.0)

## 2017-03-23 LAB — CBC WITH DIFFERENTIAL/PLATELET
BASOS PCT: 0.3 % (ref 0.0–3.0)
Basophils Absolute: 0 10*3/uL (ref 0.0–0.1)
Eosinophils Absolute: 0.2 10*3/uL (ref 0.0–0.7)
Eosinophils Relative: 2.7 % (ref 0.0–5.0)
HEMATOCRIT: 47.7 % (ref 39.0–52.0)
Hemoglobin: 16.6 g/dL (ref 13.0–17.0)
LYMPHS ABS: 1.7 10*3/uL (ref 0.7–4.0)
Lymphocytes Relative: 25.4 % (ref 12.0–46.0)
MCHC: 34.8 g/dL (ref 30.0–36.0)
MCV: 84.6 fl (ref 78.0–100.0)
MONOS PCT: 9.4 % (ref 3.0–12.0)
Monocytes Absolute: 0.6 10*3/uL (ref 0.1–1.0)
NEUTROS ABS: 4.2 10*3/uL (ref 1.4–7.7)
NEUTROS PCT: 62.2 % (ref 43.0–77.0)
PLATELETS: 202 10*3/uL (ref 150.0–400.0)
RBC: 5.63 Mil/uL (ref 4.22–5.81)
RDW: 13.2 % (ref 11.5–15.5)
WBC: 6.8 10*3/uL (ref 4.0–10.5)

## 2017-03-23 LAB — COMPREHENSIVE METABOLIC PANEL
ALT: 30 U/L (ref 0–53)
AST: 20 U/L (ref 0–37)
Albumin: 4.3 g/dL (ref 3.5–5.2)
Alkaline Phosphatase: 66 U/L (ref 39–117)
BILIRUBIN TOTAL: 0.5 mg/dL (ref 0.2–1.2)
BUN: 12 mg/dL (ref 6–23)
CALCIUM: 9.3 mg/dL (ref 8.4–10.5)
CHLORIDE: 105 meq/L (ref 96–112)
CO2: 30 meq/L (ref 19–32)
Creatinine, Ser: 1.06 mg/dL (ref 0.40–1.50)
GFR: 76.01 mL/min (ref >60.00)
GLUCOSE: 95 mg/dL (ref 70–99)
Potassium: 4.3 mEq/L (ref 3.5–5.1)
Sodium: 139 mEq/L (ref 135–145)
Total Protein: 6.3 g/dL (ref 6.0–8.3)

## 2017-03-23 LAB — LDL CHOLESTEROL, DIRECT: Direct LDL: 80 mg/dL

## 2017-03-23 LAB — TSH: TSH: 1.66 u[IU]/mL (ref 0.35–4.50)

## 2017-03-23 LAB — URINALYSIS, ROUTINE W REFLEX MICROSCOPIC
Bilirubin Urine: NEGATIVE
HGB URINE DIPSTICK: NEGATIVE
Ketones, ur: NEGATIVE
LEUKOCYTES UA: NEGATIVE
Nitrite: NEGATIVE
RBC / HPF: NONE SEEN (ref 0–?)
Specific Gravity, Urine: <=1.005 — AB (ref 1.000–1.030)
TOTAL PROTEIN, URINE-UPE24: NEGATIVE
URINE GLUCOSE: NEGATIVE
UROBILINOGEN UA: 0.2 (ref 0.0–1.0)
WBC, UA: NONE SEEN (ref 0–?)
pH: 6 (ref 5.0–8.0)

## 2017-03-23 LAB — PSA: PSA: 1.02 ng/mL (ref 0.10–4.00)

## 2017-03-23 NOTE — Patient Instructions (Signed)
 Health Maintenance, Male A healthy lifestyle and preventive care is important for your health and wellness. Ask your health care provider about what schedule of regular examinations is right for you. What should I know about weight and diet?  Eat a Healthy Diet  Eat plenty of vegetables, fruits, whole grains, low-fat dairy products, and lean protein.  Do not eat a lot of foods high in solid fats, added sugars, or salt. Maintain a Healthy Weight  Regular exercise can help you achieve or maintain a healthy weight. You should:  Do at least 150 minutes of exercise each week. The exercise should increase your heart rate and make you sweat (moderate-intensity exercise).  Do strength-training exercises at least twice a week. Watch Your Levels of Cholesterol and Blood Lipids  Have your blood tested for lipids and cholesterol every 5 years starting at 59 years of age. If you are at high risk for heart disease, you should start having your blood tested when you are 59 years old. You may need to have your cholesterol levels checked more often if:  Your lipid or cholesterol levels are high.  You are older than 59 years of age.  You are at high risk for heart disease. What should I know about cancer screening? Many types of cancers can be detected early and may often be prevented. Lung Cancer  You should be screened every year for lung cancer if:  You are a current smoker who has smoked for at least 30 years.  You are a former smoker who has quit within the past 15 years.  Talk to your health care provider about your screening options, when you should start screening, and how often you should be screened. Colorectal Cancer  Routine colorectal cancer screening usually begins at 59 years of age and should be repeated every 5-10 years until you are 59 years old. You may need to be screened more often if early forms of precancerous polyps or small growths are found. Your health care provider  may recommend screening at an earlier age if you have risk factors for colon cancer.  Your health care provider may recommend using home test kits to check for hidden blood in the stool.  A small camera at the end of a tube can be used to examine your colon (sigmoidoscopy or colonoscopy). This checks for the earliest forms of colorectal cancer. Prostate and Testicular Cancer  Depending on your age and overall health, your health care provider may do certain tests to screen for prostate and testicular cancer.  Talk to your health care provider about any symptoms or concerns you have about testicular or prostate cancer. Skin Cancer  Check your skin from head to toe regularly.  Tell your health care provider about any new moles or changes in moles, especially if:  There is a change in a mole's size, shape, or color.  You have a mole that is larger than a pencil eraser.  Always use sunscreen. Apply sunscreen liberally and repeat throughout the day.  Protect yourself by wearing long sleeves, pants, a wide-brimmed hat, and sunglasses when outside. What should I know about heart disease, diabetes, and high blood pressure?  If you are 18-39 years of age, have your blood pressure checked every 3-5 years. If you are 40 years of age or older, have your blood pressure checked every year. You should have your blood pressure measured twice-once when you are at a hospital or clinic, and once when you are not at   a hospital or clinic. Record the average of the two measurements. To check your blood pressure when you are not at a hospital or clinic, you can use:  An automated blood pressure machine at a pharmacy.  A home blood pressure monitor.  Talk to your health care provider about your target blood pressure.  If you are between 45-79 years old, ask your health care provider if you should take aspirin to prevent heart disease.  Have regular diabetes screenings by checking your fasting blood sugar  level.  If you are at a normal weight and have a low risk for diabetes, have this test once every three years after the age of 45.  If you are overweight and have a high risk for diabetes, consider being tested at a younger age or more often.  A one-time screening for abdominal aortic aneurysm (AAA) by ultrasound is recommended for men aged 65-75 years who are current or former smokers. What should I know about preventing infection? Hepatitis B  If you have a higher risk for hepatitis B, you should be screened for this virus. Talk with your health care provider to find out if you are at risk for hepatitis B infection. Hepatitis C  Blood testing is recommended for:  Everyone born from 1945 through 1965.  Anyone with known risk factors for hepatitis C. Sexually Transmitted Diseases (STDs)  You should be screened each year for STDs including gonorrhea and chlamydia if:  You are sexually active and are younger than 59 years of age.  You are older than 59 years of age and your health care provider tells you that you are at risk for this type of infection.  Your sexual activity has changed since you were last screened and you are at an increased risk for chlamydia or gonorrhea. Ask your health care provider if you are at risk.  Talk with your health care provider about whether you are at high risk of being infected with HIV. Your health care provider may recommend a prescription medicine to help prevent HIV infection. What else can I do?  Schedule regular health, dental, and eye exams.  Stay current with your vaccines (immunizations).  Do not use any tobacco products, such as cigarettes, chewing tobacco, and e-cigarettes. If you need help quitting, ask your health care provider.  Limit alcohol intake to no more than 2 drinks per day. One drink equals 12 ounces of beer, 5 ounces of wine, or 1 ounces of hard liquor.  Do not use street drugs.  Do not share needles.  Ask your health  care provider for help if you need support or information about quitting drugs.  Tell your health care provider if you often feel depressed.  Tell your health care provider if you have ever been abused or do not feel safe at home. This information is not intended to replace advice given to you by your health care provider. Make sure you discuss any questions you have with your health care provider. Document Released: 04/08/2008 Document Revised: 06/09/2016 Document Reviewed: 07/15/2015 Elsevier Interactive Patient Education  2017 Else[employer] Inc.  

## 2017-03-23 NOTE — Progress Notes (Signed)
Subjective:  Patient ID: Bradley Conner, male    DOB: 19-Jan-1956  Age: 61 y.o. MRN: [id number]  CC: Annual Exam and Hypertension   HPI Bradley Conner presents for CPX and BP check. He feels well and offers no complaints.  Outpatient Medications Prior to Visit  Medication Sig Dispense Refill  . lisinopril (PRINIVIL,ZESTRIL) 40 MG tablet TAKE 1 TABLET BY MOUTH  DAILY 90 tablet 1  . omeprazole (PRILOSEC) 20 MG capsule Take 1 capsule by mouth  daily 30 minutes before a  meal 90 capsule 3  . valACYclovir (VALTREX) 1000 MG tablet TAKE 1 TABLET BY MOUTH TWO  TIMES DAILY 120 tablet 1   No facility-administered medications prior to visit.     ROS Review of Systems  Constitutional: Negative.  Negative for diaphoresis and fatigue.  HENT: Negative.  Negative for sinus pressure and trouble swallowing.   Eyes: Negative.  Negative for visual disturbance.  Respiratory: Negative for cough, chest tightness, shortness of breath and wheezing.   Cardiovascular: Negative for chest pain, palpitations and leg swelling.  Gastrointestinal: Negative for abdominal pain, constipation, diarrhea, nausea and vomiting.  Endocrine: Negative.   Genitourinary: Negative.  Negative for difficulty urinating, hematuria, penile swelling, scrotal swelling and testicular pain.  Musculoskeletal: Negative.  Negative for back pain, myalgias and neck pain.  Skin: Negative.   Allergic/Immunologic: Negative.   Neurological: Negative.  Negative for dizziness, syncope and numbness.  Hematological: Negative.  Negative for adenopathy. Does not bruise/bleed easily.  Psychiatric/Behavioral: Negative.     Objective:  BP 128/80 (BP Location: Left Arm, Patient Position: Sitting, Cuff Size: Normal)   Pulse 79   Temp 98.1 F (36.7 C) (Oral)   Ht 5\' 7"  (1.702 m)   Wt 202 lb 4 oz (91.7 kg)   SpO2 99%   BMI 31.68 kg/m   BP Readings from Last 3 Encounters:  03/23/17 128/80  05/31/16 130/90  07/22/15 120/80    Wt Readings  from Last 3 Encounters:  03/23/17 202 lb 4 oz (91.7 kg)  05/31/16 210 lb 4 oz (95.4 kg)  07/22/15 204 lb (92.5 kg)    Physical Exam  Constitutional: He is oriented to person, place, and time. No distress.  HENT:  Mouth/Throat: Oropharynx is clear and moist. No oropharyngeal exudate.  Eyes: Conjunctivae are normal. Right eye exhibits no discharge. Left eye exhibits no discharge. No scleral icterus.  Neck: Normal range of motion. Neck supple. No JVD present. No tracheal deviation present. No thyromegaly present.  Cardiovascular: Normal rate, regular rhythm, normal heart sounds and intact distal pulses.   No murmur heard. EKG ---  Sinus  Rhythm  WITHIN NORMAL LIMITS  No LVH, no changes noted  Pulmonary/Chest: Effort normal and breath sounds normal. No stridor. He has no wheezes. He has no rales.  Abdominal: Soft. Bowel sounds are normal. He exhibits no distension and no mass. There is no tenderness. There is no rebound and no guarding.  Musculoskeletal: Normal range of motion. He exhibits no edema, tenderness or deformity.  Lymphadenopathy:    He has no cervical adenopathy.  Neurological: He is oriented to person, place, and time.  Skin: Skin is warm and dry. No rash noted. He is not diaphoretic. No erythema. No pallor.  Vitals reviewed.   Lab Results  Component Value Date   WBC 6.8 03/23/2017   HGB 16.6 03/23/2017   HCT 47.7 03/23/2017   PLT 202.0 03/23/2017   GLUCOSE 95 03/23/2017   CHOL 136 03/23/2017   TRIG 216.0 (  H) 03/23/2017   HDL 17.60 (L) 03/23/2017   LDLDIRECT 80.0 03/23/2017   LDLCALC 81 07/22/2015   ALT 30 03/23/2017   AST 20 03/23/2017   NA 139 03/23/2017   K 4.3 03/23/2017   CL 105 03/23/2017   CREATININE 1.06 03/23/2017   BUN 12 03/23/2017   CO2 30 03/23/2017   TSH 1.66 03/23/2017   PSA 1.02 03/23/2017   HGBA1C 5.4 08/12/2014    Ct Abdomen Pelvis W Contrast  Result Date: 12/02/2013 CLINICAL DATA:  Abdominal pain with nausea and vomiting EXAM: CT  ABDOMEN AND PELVIS WITH CONTRAST TECHNIQUE: Multidetector CT imaging of the abdomen and pelvis was performed using the standard protocol following bolus administration of intravenous contrast. CONTRAST:  8mL OMNIPAQUE IOHEXOL 300 MG/ML SOLN, 171mL OMNIPAQUE IOHEXOL 300 MG/ML SOLN COMPARISON:  None. FINDINGS: Lung bases are free of acute infiltrate or sizable effusion. The liver, gallbladder, spleen, adrenal glands and pancreas are within normal limits. The kidneys are well visualized and demonstrate a normal enhancement pattern. A tiny cystic lesion is noted in the upper pole of the left kidney. Appendix is well visualized and within normal limits. Scattered loops of mildly dilated small bowel are noted in the mid and right abdomen. These involve primarily the distal jejunum and entire ileum. The terminal ileum however is within normal limits. No definitive transition zone is seen. These changes the bladder is well distended. No pelvic mass lesion is seen. Mild diverticular change of the colon is noted without diverticulitis. May represent a small bowel ileus. IMPRESSION: Dilated loops of small bowel involving the jejunum and ileum. No definitive transition zone is seen. Correlation with the physical exam is recommended as this may represent a small bowel ileus. No other acute abnormality is seen. Electronically Signed   By: Bradley Conner M.D.   On: 12/02/2013 11:12    Assessment & Plan:   Bradley Conner was seen today for annual exam and hypertension.  Diagnoses and all orders for this visit:  Essential hypertension- His EKG is negative for LVH or evidence of ischemia. His blood pressure is well-controlled, electrolytes and renal function are stable, will continue the ACE inhibitor at the current dose. -     EKG 12-Lead  Routine general medical examination at a health care facility- exam completed, labs ordered and reviewed, vaccines reviewed, colon cancer screening is up-to-date, his Framingham risk score is  only 8% so at this time I do not recommend a statin, patient education material was given. -     Lipid panel; Future -     Comprehensive metabolic panel; Future -     CBC with Differential/Platelet; Future -     TSH; Future -     Urinalysis, Routine w reflex microscopic; Future -     PSA; Future   I am having Mr. Baksh maintain his omeprazole, lisinopril, and valACYclovir.  No orders of the defined types were placed in this encounter.    Follow-up: Return in about 1 year (around 03/23/2018).  Scarlette Calico, MD

## 2017-04-25 ENCOUNTER — Other Ambulatory Visit: Payer: Self-pay | Admitting: Internal Medicine

## 2017-04-25 DIAGNOSIS — I10 Essential (primary) hypertension: Secondary | ICD-10-CM

## 2017-08-09 ENCOUNTER — Other Ambulatory Visit: Payer: Self-pay | Admitting: Internal Medicine

## 2017-08-09 ENCOUNTER — Telehealth: Payer: Self-pay | Admitting: Internal Medicine

## 2017-08-09 NOTE — Telephone Encounter (Signed)
Pt called asking for a referral to be sent to Dr [facility]. He said that he has seen him in past for ongoing foot issues but now his insurance is requiring a referral. Can this be put in for him?

## 2017-08-09 NOTE — Telephone Encounter (Signed)
He told me that he has had continued issued with plantar fasciitis.

## 2017-08-09 NOTE — Telephone Encounter (Signed)
What is the diagnosis that will be used for the referral?

## 2017-08-10 ENCOUNTER — Other Ambulatory Visit: Payer: Self-pay | Admitting: Internal Medicine

## 2017-08-10 DIAGNOSIS — M722 Plantar fascial fibromatosis: Secondary | ICD-10-CM

## 2017-08-10 NOTE — Telephone Encounter (Signed)
Dr. Ronnald Ramp has entered the referral.

## 2017-08-19 ENCOUNTER — Telehealth: Payer: Self-pay | Admitting: Internal Medicine

## 2017-08-19 NOTE — Telephone Encounter (Signed)
The referral was canceled because they thought that he was needing a new referral. She asked that one be retroed to April 12 2017 for insurance purposes.

## 2017-08-19 NOTE — Telephone Encounter (Signed)
Referral done and faxed to Dr. [facility] [phone]

## 2017-08-23 NOTE — Telephone Encounter (Signed)
Error

## 2017-10-05 ENCOUNTER — Other Ambulatory Visit: Payer: Self-pay | Admitting: Internal Medicine

## 2017-10-19 ENCOUNTER — Encounter: Payer: Self-pay | Admitting: Family

## 2017-10-19 ENCOUNTER — Other Ambulatory Visit (INDEPENDENT_AMBULATORY_CARE_PROVIDER_SITE_OTHER): Payer: 59

## 2017-10-19 ENCOUNTER — Ambulatory Visit: Payer: 59 | Admitting: Nurse Practitioner

## 2017-10-19 ENCOUNTER — Ambulatory Visit (INDEPENDENT_AMBULATORY_CARE_PROVIDER_SITE_OTHER): Payer: 59 | Admitting: Family

## 2017-10-19 VITALS — BP 114/82 | HR 95 | Temp 98.9°F | Ht 68.0 in | Wt 208.0 lb

## 2017-10-19 DIAGNOSIS — R197 Diarrhea, unspecified: Secondary | ICD-10-CM

## 2017-10-19 LAB — CBC WITH DIFFERENTIAL/PLATELET
BASOS ABS: 0 10*3/uL (ref 0.0–0.1)
BASOS PCT: 0.3 % (ref 0.0–3.0)
EOS PCT: 1.2 % (ref 0.0–5.0)
Eosinophils Absolute: 0.1 10*3/uL (ref 0.0–0.7)
HEMATOCRIT: 52.9 % — AB (ref 39.0–52.0)
Hemoglobin: 17.8 g/dL — ABNORMAL HIGH (ref 13.0–17.0)
LYMPHS PCT: 18.8 % (ref 12.0–46.0)
Lymphs Abs: 1.8 10*3/uL (ref 0.7–4.0)
MCHC: 33.7 g/dL (ref 30.0–36.0)
MCV: 85.7 fl (ref 78.0–100.0)
MONOS PCT: 11.7 % (ref 3.0–12.0)
Monocytes Absolute: 1.1 10*3/uL — ABNORMAL HIGH (ref 0.1–1.0)
NEUTROS ABS: 6.6 10*3/uL (ref 1.4–7.7)
Neutrophils Relative %: 68 % (ref 43.0–77.0)
PLATELETS: 257 10*3/uL (ref 150.0–400.0)
RBC: 6.17 Mil/uL — ABNORMAL HIGH (ref 4.22–5.81)
RDW: 13.3 % (ref 11.5–15.5)
WBC: 9.8 10*3/uL (ref 4.0–10.5)

## 2017-10-19 LAB — COMPREHENSIVE METABOLIC PANEL
ALT: 37 U/L (ref 0–53)
AST: 16 U/L (ref 0–37)
Albumin: 4.1 g/dL (ref 3.5–5.2)
Alkaline Phosphatase: 57 U/L (ref 39–117)
BILIRUBIN TOTAL: 0.7 mg/dL (ref 0.2–1.2)
BUN: 16 mg/dL (ref 6–23)
CALCIUM: 9.2 mg/dL (ref 8.4–10.5)
CHLORIDE: 100 meq/L (ref 96–112)
CO2: 25 mEq/L (ref 19–32)
Creatinine, Ser: 1.18 mg/dL (ref 0.40–1.50)
GFR: 67.03 mL/min (ref >60.00)
Glucose, Bld: 102 mg/dL — ABNORMAL HIGH (ref 70–99)
POTASSIUM: 3.7 meq/L (ref 3.5–5.1)
Sodium: 135 mEq/L (ref 135–145)
Total Protein: 6.8 g/dL (ref 6.0–8.3)

## 2017-10-19 NOTE — Patient Instructions (Signed)
Diarrhea, Adult °Diarrhea is when you have loose and water poop (stool) often. Diarrhea can make you feel weak and cause you to get dehydrated. Dehydration can make you tired and thirsty, make you have a dry mouth, and make it so you pee (urinate) less often. Diarrhea often lasts 2-3 days. However, it can last longer if it is a sign of something more serious. It is important to treat your diarrhea as told by your doctor. °Follow these instructions at home: °Eating and drinking ° °Follow these recommendations as told by your doctor: °· Take an oral rehydration solution (ORS). This is a drink that is sold at pharmacies and stores. °· Drink clear fluids, such as: °? Water. °? Ice chips. °? Diluted fruit juice. °? Low-calorie sports drinks. °· Eat bland, easy-to-digest foods in small amounts as you are able. These foods include: °? Bananas. °? Applesauce. °? Rice. °? Low-fat (lean) meats. °? Toast. °? Crackers. °· Avoid drinking fluids that have a lot of sugar or caffeine in them. °· Avoid alcohol. °· Avoid spicy or fatty foods. ° °General instructions ° °· Drink enough fluid to keep your pee (urine) clear or pale yellow. °· Wash your hands often. If you cannot use soap and water, use hand sanitizer. °· Make sure that all people in your home wash their hands well and often. °· Take over-the-counter and prescription medicines only as told by your doctor. °· Rest at home while you get better. °· Watch your condition for any changes. °· Take a warm bath to help with any burning or pain from having diarrhea. °· Keep all follow-up visits as told by your doctor. This is important. °Contact a doctor if: °· You have a fever. °· Your diarrhea gets worse. °· You have new symptoms. °· You cannot keep fluids down. °· You feel light-headed or dizzy. °· You have a headache. °· You have muscle cramps. °Get help right away if: °· You have chest pain. °· You feel very weak or you pass out (faint). °· You have bloody or black poop or  poop that look like tar. °· You have very bad pain, cramping, or bloating in your belly (abdomen). °· You have trouble breathing or you are breathing very quickly. °· Your heart is beating very quickly. °· Your skin feels cold and clammy. °· You feel confused. °· You have signs of dehydration, such as: °? Dark pee, hardly any pee, or no pee. °? Cracked lips. °? Dry mouth. °? Sunken eyes. °? Sleepiness. °? Weakness. °This information is not intended to replace advice given to you by your health care provider. Make sure you discuss any questions you have with your health care provider. °Document Released: 03/29/2008 Document Revised: 04/30/2016 Document Reviewed: 06/17/2015 °Elsevier Interactive Patient Education © 2018 [employer] Inc. ° °

## 2017-10-19 NOTE — Progress Notes (Signed)
Bradley Conner is a 61 y.o. male with the following history as recorded in [employer]:  Patient Active Problem List   Diagnosis Date Noted  . Plantar fasciitis, bilateral 08/10/2017  . Tubular adenoma of colon 07/22/2015  . Routine general medical examination at a health care facility 06/16/2012  . Hyperglycemia 06/16/2012  . Mixed hyperlipidemia 04/07/2011  . Allergic rhinitis, cause unspecified 02/05/2011  . 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 Anxiety state 01/09/2009  . BENIGN PROSTATIC HYPERTROPHY 01/09/2009  . Essential hypertension 12/14/2007  . GERD 12/14/2007  . Peptic stricture of esophagus 12/14/2007    Current Outpatient Medications  Medication Sig Dispense Refill  . lisinopril (PRINIVIL,ZESTRIL) 40 MG tablet TAKE 1 TABLET BY MOUTH  DAILY 90 tablet 1  . omeprazole (PRILOSEC) 20 MG capsule TAKE 1 CAPSULE BY MOUTH  DAILY 30 MINUTES BEFORE A  MEAL 90 capsule 1  . valACYclovir (VALTREX) 1000 MG tablet TAKE 1 TABLET BY MOUTH TWO  TIMES DAILY 120 tablet 1   No current facility-administered medications for this visit.     Allergies: Sulfamethoxazole  Past Medical History:  Diagnosis Date  . Anxiety   . BPH (benign prostatic hypertrophy)   . Cold sore   . Genital herpes   . GERD (gastroesophageal reflux disease)   . HTN (hypertension)   . Plantar fasciitis     History reviewed. No pertinent surgical history.  Family History  Problem Relation Age of Onset  . Arthritis Other   . Hypertension Other   . Heart disease Father   . Cancer Neg Hx   . COPD Neg Hx   . Diabetes Neg Hx   . Drug abuse Neg Hx   . Early death Neg Hx   . Hyperlipidemia Neg Hx   . Kidney disease Neg Hx   . Stroke Neg Hx     Social History   Tobacco Use  . Smoking status: Never Smoker  . Smokeless tobacco: Never Used  Substance Use Topics  . Alcohol use: No    Subjective:  Started with sudden onset of diarrhea last Saturday night; notes that only thing  he ate that his wife didn't was a piece of pizza; 3 different episodes of vomiting on Sunday morning; no further vomiting since Sunday morning; has not seen any blood in stool or vomiting; took Kaopectate this morning with benefit; notes that diarrhea seems to be improving but stomach just "feels tight and sore." Has not taking Prilosec since Saturday; thinks may have run a fever initially; has been able to go to work today; has only had a small amount of water today;   Objective:  Vitals:   10/19/17 1423  BP: 114/82  Pulse: 95  Temp: 98.9 F (37.2 C)  TempSrc: Oral  SpO2: 99%  Weight: 208 lb (94.3 kg)  Height: 5\' 8"  (1.727 m)    General: Well developed, well nourished, in no acute distress  Skin : Warm and dry.  Head: Normocephalic and atraumatic  Eyes: Sclera and conjunctiva clear; pupils round and reactive to light; extraocular movements intact  Lungs: Respirations unlabored; clear to auscultation bilaterally without wheeze, rales, rhonchi  Abdomen: Soft; nontender; nondistended; normoactive bowel sounds; no masses or hepatosplenomegaly  Musculoskeletal: No deformities; no active joint inflammation  Extremities: No edema, cyanosis, clubbing  Vessels: Symmetric bilaterally  Neurologic: Alert and oriented; speech intact; face symmetrical; moves all extremities well; CNII-XII intact without focal deficit  Assessment:  1. Diarrhea, unspecified type  Plan:  Suspect acute/ food-borne as patient is improving today; colonoscopy is up to date; okay with short term use of Kaopectate but stressed not take more than 2 days or so;  will check CBC, CMP today; encouraged Gatorade/ BRAT diet; patient to also re-start his Prilosec; if symptoms continue will need to consider, stool culture; follow-up to be determined.   No Follow-up on file.  Orders Placed This Encounter  Procedures  . CBC with Differential    Standing Status:   Future    Number of Occurrences:   1    Standing Expiration  Date:   10/19/2018  . Comprehensive metabolic panel    Standing Status:   Future    Number of Occurrences:   1    Standing Expiration Date:   10/19/2018    Requested Prescriptions    No prescriptions requested or ordered in this encounter

## 2018-02-02 ENCOUNTER — Other Ambulatory Visit: Payer: Self-pay | Admitting: Internal Medicine

## 2018-02-02 DIAGNOSIS — I10 Essential (primary) hypertension: Secondary | ICD-10-CM

## 2018-02-02 NOTE — Telephone Encounter (Signed)
Physical scheduled on June 4th.

## 2018-02-02 NOTE — Telephone Encounter (Signed)
Pt is due for an annual around May. Can you inform pt and schedule for same.

## 2018-03-28 ENCOUNTER — Ambulatory Visit (INDEPENDENT_AMBULATORY_CARE_PROVIDER_SITE_OTHER)
Admission: RE | Admit: 2018-03-28 | Discharge: 2018-03-28 | Disposition: A | Discharge status: Home / Self Care | Payer: 59 | Source: Ambulatory Visit | Attending: Internal Medicine | Admitting: Internal Medicine

## 2018-03-28 ENCOUNTER — Encounter: Payer: Self-pay | Admitting: Internal Medicine

## 2018-03-28 ENCOUNTER — Ambulatory Visit (INDEPENDENT_AMBULATORY_CARE_PROVIDER_SITE_OTHER): Payer: 59 | Admitting: Internal Medicine

## 2018-03-28 ENCOUNTER — Other Ambulatory Visit (INDEPENDENT_AMBULATORY_CARE_PROVIDER_SITE_OTHER): Payer: 59

## 2018-03-28 VITALS — BP 130/80 | HR 67 | Temp 98.7°F | Resp 16 | Ht 68.0 in | Wt 209.5 lb

## 2018-03-28 DIAGNOSIS — M159 Polyosteoarthritis, unspecified: Secondary | ICD-10-CM | POA: Insufficient documentation

## 2018-03-28 DIAGNOSIS — I10 Essential (primary) hypertension: Secondary | ICD-10-CM

## 2018-03-28 DIAGNOSIS — M25552 Pain in left hip: Secondary | ICD-10-CM | POA: Diagnosis not present

## 2018-03-28 DIAGNOSIS — Z0000 Encounter for general adult medical examination without abnormal findings: Secondary | ICD-10-CM | POA: Diagnosis not present

## 2018-03-28 DIAGNOSIS — G8929 Other chronic pain: Secondary | ICD-10-CM | POA: Diagnosis not present

## 2018-03-28 DIAGNOSIS — M150 Primary generalized (osteo)arthritis: Secondary | ICD-10-CM | POA: Diagnosis not present

## 2018-03-28 DIAGNOSIS — E782 Mixed hyperlipidemia: Secondary | ICD-10-CM

## 2018-03-28 LAB — LIPID PANEL
CHOLESTEROL: 156 mg/dL (ref 0–200)
HDL: 19.5 mg/dL — ABNORMAL LOW (ref >39.00)
LDL Cholesterol: 101 mg/dL — ABNORMAL HIGH (ref 0–99)
NonHDL: 136.35
TRIGLYCERIDES: 177 mg/dL — AB (ref 0.0–149.0)
Total CHOL/HDL Ratio: 8
VLDL: 35.4 mg/dL (ref 0.0–40.0)

## 2018-03-28 LAB — CBC WITH DIFFERENTIAL/PLATELET
BASOS PCT: 0.5 % (ref 0.0–3.0)
Basophils Absolute: 0 10*3/uL (ref 0.0–0.1)
Eosinophils Absolute: 0.2 10*3/uL (ref 0.0–0.7)
Eosinophils Relative: 3.3 % (ref 0.0–5.0)
HEMATOCRIT: 48.3 % (ref 39.0–52.0)
Hemoglobin: 16.7 g/dL (ref 13.0–17.0)
LYMPHS PCT: 26.4 % (ref 12.0–46.0)
Lymphs Abs: 1.9 10*3/uL (ref 0.7–4.0)
MCHC: 34.6 g/dL (ref 30.0–36.0)
MCV: 85 fl (ref 78.0–100.0)
MONOS PCT: 9.7 % (ref 3.0–12.0)
Monocytes Absolute: 0.7 10*3/uL (ref 0.1–1.0)
Neutro Abs: 4.3 10*3/uL (ref 1.4–7.7)
Neutrophils Relative %: 60.1 % (ref 43.0–77.0)
PLATELETS: 218 10*3/uL (ref 150.0–400.0)
RBC: 5.68 Mil/uL (ref 4.22–5.81)
RDW: 13.5 % (ref 11.5–15.5)
WBC: 7.1 10*3/uL (ref 4.0–10.5)

## 2018-03-28 LAB — COMPREHENSIVE METABOLIC PANEL
ALBUMIN: 4.4 g/dL (ref 3.5–5.2)
ALK PHOS: 72 U/L (ref 39–117)
ALT: 26 U/L (ref 0–53)
AST: 16 U/L (ref 0–37)
BILIRUBIN TOTAL: 0.6 mg/dL (ref 0.2–1.2)
BUN: 12 mg/dL (ref 6–23)
CALCIUM: 9.6 mg/dL (ref 8.4–10.5)
CHLORIDE: 103 meq/L (ref 96–112)
CO2: 28 mEq/L (ref 19–32)
CREATININE: 1.14 mg/dL (ref 0.40–1.50)
GFR: 69.65 mL/min (ref >60.00)
Glucose, Bld: 111 mg/dL — ABNORMAL HIGH (ref 70–99)
Potassium: 4.6 mEq/L (ref 3.5–5.1)
Sodium: 139 mEq/L (ref 135–145)
Total Protein: 6.4 g/dL (ref 6.0–8.3)

## 2018-03-28 LAB — PSA: PSA: 1.13 ng/mL (ref 0.10–4.00)

## 2018-03-28 MED ORDER — DICLOFENAC 35 MG PO CAPS: 1.0000 | ORAL_CAPSULE | Freq: Three times a day (TID) | ORAL | 3 refills | Status: DC | PRN

## 2018-03-28 NOTE — Patient Instructions (Signed)

## 2018-03-28 NOTE — Progress Notes (Signed)
Subjective:  Patient ID: Bradley Conner, male    DOB: 05-29-1956  Age: 62 y.o. MRN: [id number]  CC: Hypertension; Hyperlipidemia; Annual Exam; and Osteoarthritis   HPI Bradley Conner presents for a CPX.  He complains of a 66-month history of left anterior hip pain.  He denies any preceding trauma or injury.  He says the pain increases with activity and movement and keeps him awake at night.  He has tried to control the pain with turmeric and CBD oil but has not gotten much improvement.  He also complains of pain in his other large joints including his knees and shoulders.  He tells me his blood pressure has been well controlled.  He is very active and denies any recent episodes of CP, DOE, cough, shortness of breath, or fatigue.  Outpatient Medications Prior to Visit  Medication Sig Dispense Refill  . lisinopril (PRINIVIL,ZESTRIL) 40 MG tablet Take 1 tablet (40 mg total) by mouth daily. 90 tablet 0  . omeprazole (PRILOSEC) 20 MG capsule TAKE 1 CAPSULE BY MOUTH  DAILY 30 MINUTES BEFORE A  MEAL 90 capsule 1  . valACYclovir (VALTREX) 1000 MG tablet TAKE 1 TABLET BY MOUTH TWO  TIMES DAILY 120 tablet 1   No facility-administered medications prior to visit.     ROS Review of Systems  Constitutional: Negative.  Negative for appetite change, diaphoresis, fatigue and unexpected weight change.  HENT: Negative.   Eyes: Negative for visual disturbance.  Respiratory: Negative for cough, chest tightness, shortness of breath and wheezing.   Cardiovascular: Negative.  Negative for chest pain, palpitations and leg swelling.  Gastrointestinal: Negative for abdominal pain, constipation, diarrhea, nausea and vomiting.  Endocrine: Negative.   Genitourinary: Negative.  Negative for difficulty urinating, penile pain, penile swelling, scrotal swelling, testicular pain and urgency.  Musculoskeletal: Positive for arthralgias. Negative for back pain, myalgias and neck pain.  Skin: Negative.     Allergic/Immunologic: Negative.   Neurological: Negative.  Negative for dizziness, weakness and light-headedness.  Hematological: Negative for adenopathy. Does not bruise/bleed easily.  Psychiatric/Behavioral: Negative.     Objective:  BP 130/80 (BP Location: Left Arm, Patient Position: Sitting, Cuff Size: Large)   Pulse 67   Temp 98.7 F (37.1 C) (Oral)   Resp 16   Ht 5\' 8"  (1.727 m)   Wt 209 lb 8 oz (95 kg)   SpO2 97%   BMI 31.85 kg/m   BP Readings from Last 3 Encounters:  03/28/18 130/80  10/19/17 114/82  03/23/17 128/80    Wt Readings from Last 3 Encounters:  03/28/18 209 lb 8 oz (95 kg)  10/19/17 208 lb (94.3 kg)  03/23/17 202 lb 4 oz (91.7 kg)    Physical Exam  Constitutional: No distress.  HENT:  Mouth/Throat: Oropharynx is clear and moist. No oropharyngeal exudate.  Eyes: Conjunctivae are normal. No scleral icterus.  Neck: Normal range of motion. Neck supple. No JVD present. No thyromegaly present.  Cardiovascular: Normal rate, regular rhythm and normal heart sounds. Exam reveals no gallop.  No murmur heard. Pulmonary/Chest: Effort normal and breath sounds normal. No stridor. He has no rales.  Abdominal: Soft. Normal appearance and bowel sounds are normal. He exhibits no mass. There is no hepatosplenomegaly. There is no tenderness. Hernia confirmed negative in the right inguinal area and confirmed negative in the left inguinal area.  Genitourinary: Rectum normal, prostate normal, testes normal and penis normal. Rectal exam shows no external hemorrhoid, no internal hemorrhoid, no fissure, no mass, no tenderness, anal  tone normal and guaiac negative stool. Prostate is not enlarged and not tender. Cremasteric reflex is present. Right testis shows no mass, no swelling and no tenderness. Left testis shows no mass, no swelling and no tenderness. Circumcised. No penile erythema or penile tenderness. No discharge found.  Musculoskeletal:       Right shoulder: Normal.        Left shoulder: Normal.       Left hip: Normal. He exhibits normal range of motion, normal strength, no tenderness, no bony tenderness, no swelling, no crepitus and no deformity.       Right knee: He exhibits deformity (DJD). He exhibits normal range of motion, no swelling and no effusion.       Left knee: He exhibits deformity (DJD). He exhibits normal range of motion, no swelling and no effusion.  Lymphadenopathy:    He has no cervical adenopathy. No inguinal adenopathy noted on the right or left side.  Skin: He is not diaphoretic.  Vitals reviewed.   Lab Results  Component Value Date   WBC 7.1 03/28/2018   HGB 16.7 03/28/2018   HCT 48.3 03/28/2018   PLT 218.0 03/28/2018   GLUCOSE 111 (H) 03/28/2018   CHOL 156 03/28/2018   TRIG 177.0 (H) 03/28/2018   HDL 19.50 (L) 03/28/2018   LDLDIRECT 80.0 03/23/2017   LDLCALC 101 (H) 03/28/2018   ALT 26 03/28/2018   AST 16 03/28/2018   NA 139 03/28/2018   K 4.6 03/28/2018   CL 103 03/28/2018   CREATININE 1.14 03/28/2018   BUN 12 03/28/2018   CO2 28 03/28/2018   TSH 1.66 03/23/2017   PSA 1.13 03/28/2018   HGBA1C 5.4 08/12/2014    Ct Abdomen Pelvis W Contrast  Result Date: 12/02/2013 CLINICAL DATA:  Abdominal pain with nausea and vomiting EXAM: CT ABDOMEN AND PELVIS WITH CONTRAST TECHNIQUE: Multidetector CT imaging of the abdomen and pelvis was performed using the standard protocol following bolus administration of intravenous contrast. CONTRAST:  25mL OMNIPAQUE IOHEXOL 300 MG/ML SOLN, 138mL OMNIPAQUE IOHEXOL 300 MG/ML SOLN COMPARISON:  None. FINDINGS: Lung bases are free of acute infiltrate or sizable effusion. The liver, gallbladder, spleen, adrenal glands and pancreas are within normal limits. The kidneys are well visualized and demonstrate a normal enhancement pattern. A tiny cystic lesion is noted in the upper pole of the left kidney. Appendix is well visualized and within normal limits. Scattered loops of mildly dilated small bowel are  noted in the mid and right abdomen. These involve primarily the distal jejunum and entire ileum. The terminal ileum however is within normal limits. No definitive transition zone is seen. These changes the bladder is well distended. No pelvic mass lesion is seen. Mild diverticular change of the colon is noted without diverticulitis. May represent a small bowel ileus. IMPRESSION: Dilated loops of small bowel involving the jejunum and ileum. No definitive transition zone is seen. Correlation with the physical exam is recommended as this may represent a small bowel ileus. No other acute abnormality is seen. Electronically Signed   By: Inez Catalina M.D.   On: 12/02/2013 11:12    Assessment & Plan:   Neale was seen today for hypertension, hyperlipidemia, annual exam and osteoarthritis.  Diagnoses and all orders for this visit:  Essential hypertension- His blood pressure is adequately well controlled.  Electrolytes and renal function are normal.  Will continue the ACEI at the current dose. -     CBC with Differential/Platelet; Future -     Comprehensive metabolic  panel; Future  Routine general medical examination at a health care facility- Exam completed, labs reviewed, vaccines reviewed, screening for colon cancer is up-to-date, patient education material was given. -     Lipid panel; Future -     PSA; Future  Chronic left hip pain- Plain films are normal.  Will treat for DJD with an anti-inflammatory. -     DG HIP UNILAT WITH PELVIS 2-3 VIEWS LEFT; Future  Primary osteoarthritis involving multiple joints -     Diclofenac (ZORVOLEX) 35 MG CAPS; Take 1 capsule by mouth 3 (three) times daily with meals as needed.  Mixed hyperlipidemia- His ASCVD risk score is low so I do not recommend statin therapy.  His trigs are slightly elevated so I have encouraged him to work on his lifestyle modifications.   I am having Woodroe W. Wenz start on Diclofenac. I am also having him maintain his valACYclovir,  omeprazole, and lisinopril.  Meds ordered this encounter  Medications  . Diclofenac (ZORVOLEX) 35 MG CAPS    Sig: Take 1 capsule by mouth 3 (three) times daily with meals as needed.    Dispense:  90 capsule    Refill:  3     Follow-up: Return in about 6 months (around 09/27/2018).  Scarlette Calico, MD

## 2018-03-29 ENCOUNTER — Telehealth: Payer: Self-pay | Admitting: Internal Medicine

## 2018-03-29 NOTE — Telephone Encounter (Signed)
Pt given lab results. Expressed understanding. Told him to watch sugars. I Set up next appt in December and told him to fast for lab work.

## 2018-03-29 NOTE — Telephone Encounter (Signed)
Copied from [place] [id number]. Topic: Quick Communication - Lab Results >> Mar 29, 2018 12:35 PM Cairrikier Dian Queen, CMA wrote: 03/28/2018 lab results may be given to pt when/if he calls back.  >> Mar 29, 2018  1:00 PM Cleaster Corin, NT wrote: Pt. Calling back to receive lab results NT line busy pt. disconnected call

## 2018-04-11 ENCOUNTER — Telehealth: Payer: Self-pay | Admitting: Internal Medicine

## 2018-04-11 NOTE — Telephone Encounter (Signed)
Copied from [place] [id number]. Topic: General - Other >> Apr 11, 2018  8:38 AM Lennox Solders wrote: Reason for CRM: pt is calling and was given samples of zorvolex and would like a rx sent to [address]. Pt saw dr Ronnald Ramp on 03-28-18

## 2018-04-12 NOTE — Telephone Encounter (Signed)
Spoke to PCP and he has sent the rx to the specialty pharmacy in [place]. I tried to call pt to inform of same and that we will have samples brought to [place] some time tomorrow.  The number listed are all busy so I was not able to get in touch with him.

## 2018-04-18 NOTE — Telephone Encounter (Signed)
Pt aware and coming by office for the samples.

## 2018-05-22 ENCOUNTER — Other Ambulatory Visit: Payer: Self-pay | Admitting: Internal Medicine

## 2018-06-04 ENCOUNTER — Other Ambulatory Visit: Payer: Self-pay | Admitting: Internal Medicine

## 2018-06-04 DIAGNOSIS — I10 Essential (primary) hypertension: Secondary | ICD-10-CM

## 2018-06-12 ENCOUNTER — Other Ambulatory Visit: Payer: Self-pay | Admitting: Internal Medicine

## 2018-07-26 ENCOUNTER — Telehealth: Payer: Self-pay

## 2018-07-26 DIAGNOSIS — M722 Plantar fascial fibromatosis: Secondary | ICD-10-CM

## 2018-07-26 NOTE — Telephone Encounter (Signed)
Copied from [place] [id number]. Topic: Referral - Request >> Jul 25, 2018  2:24 PM Berneta Levins wrote: Reason for CRM:   Pt calling.  States that he needs a referral to [facility] for an appointment he scheduled himself (10/04 at 3:30pm) so that insurance will pay for it. >> Jul 25, 2018  3:56 PM Para Skeans A wrote: Patient was seen in 03/2018 for a CPE. Do you want him to set up an appointment?  His appointment is before his provider is back in office.

## 2018-07-26 NOTE — Telephone Encounter (Signed)
Contacted pt and confirmed the reason for the referral. Referral has been entered with specifics per pt. Ie: dx code and location with day and time of appt.

## 2018-07-27 ENCOUNTER — Encounter: Payer: Self-pay | Admitting: Internal Medicine

## 2018-09-27 ENCOUNTER — Encounter: Payer: Self-pay | Admitting: Internal Medicine

## 2018-09-27 ENCOUNTER — Other Ambulatory Visit (INDEPENDENT_AMBULATORY_CARE_PROVIDER_SITE_OTHER): Payer: 59

## 2018-09-27 ENCOUNTER — Ambulatory Visit (INDEPENDENT_AMBULATORY_CARE_PROVIDER_SITE_OTHER): Payer: 59 | Admitting: Internal Medicine

## 2018-09-27 VITALS — BP 144/96 | HR 60 | Temp 98.2°F | Resp 16 | Ht 68.0 in | Wt 211.0 lb

## 2018-09-27 DIAGNOSIS — R739 Hyperglycemia, unspecified: Secondary | ICD-10-CM

## 2018-09-27 DIAGNOSIS — I10 Essential (primary) hypertension: Secondary | ICD-10-CM

## 2018-09-27 DIAGNOSIS — M159 Polyosteoarthritis, unspecified: Secondary | ICD-10-CM

## 2018-09-27 DIAGNOSIS — M150 Primary generalized (osteo)arthritis: Secondary | ICD-10-CM | POA: Diagnosis not present

## 2018-09-27 DIAGNOSIS — M25552 Pain in left hip: Secondary | ICD-10-CM | POA: Diagnosis not present

## 2018-09-27 DIAGNOSIS — G8929 Other chronic pain: Secondary | ICD-10-CM

## 2018-09-27 DIAGNOSIS — M5106 Intervertebral disc disorders with myelopathy, lumbar region: Secondary | ICD-10-CM

## 2018-09-27 LAB — BASIC METABOLIC PANEL
BUN: 13 mg/dL (ref 6–23)
CALCIUM: 9.4 mg/dL (ref 8.4–10.5)
CO2: 27 meq/L (ref 19–32)
Chloride: 104 mEq/L (ref 96–112)
Creatinine, Ser: 1.17 mg/dL (ref 0.40–1.50)
GFR: 67.48 mL/min (ref >60.00)
GLUCOSE: 112 mg/dL — AB (ref 70–99)
Potassium: 4.4 mEq/L (ref 3.5–5.1)
SODIUM: 137 meq/L (ref 135–145)

## 2018-09-27 LAB — CBC WITH DIFFERENTIAL/PLATELET
BASOS ABS: 0 10*3/uL (ref 0.0–0.1)
Basophils Relative: 0.5 % (ref 0.0–3.0)
Eosinophils Absolute: 0.2 10*3/uL (ref 0.0–0.7)
Eosinophils Relative: 2.4 % (ref 0.0–5.0)
HCT: 49.4 % (ref 39.0–52.0)
Hemoglobin: 16.9 g/dL (ref 13.0–17.0)
LYMPHS ABS: 1.9 10*3/uL (ref 0.7–4.0)
Lymphocytes Relative: 29.1 % (ref 12.0–46.0)
MCHC: 34.3 g/dL (ref 30.0–36.0)
MCV: 85.3 fl (ref 78.0–100.0)
MONO ABS: 0.7 10*3/uL (ref 0.1–1.0)
Monocytes Relative: 10.5 % (ref 3.0–12.0)
NEUTROS ABS: 3.8 10*3/uL (ref 1.4–7.7)
Neutrophils Relative %: 57.5 % (ref 43.0–77.0)
PLATELETS: 225 10*3/uL (ref 150.0–400.0)
RBC: 5.79 Mil/uL (ref 4.22–5.81)
RDW: 13.5 % (ref 11.5–15.5)
WBC: 6.6 10*3/uL (ref 4.0–10.5)

## 2018-09-27 LAB — HEMOGLOBIN A1C: Hgb A1c MFr Bld: 5.5 % (ref 4.6–6.5)

## 2018-09-27 MED ORDER — AZILSARTAN MEDOXOMIL 80 MG PO TABS
1.0000 | ORAL_TABLET | Freq: Every day | ORAL | 1 refills | Status: DC
Start: 2018-09-27 — End: 2018-10-02

## 2018-09-27 MED ORDER — DICLOFENAC 35 MG PO CAPS: 1.0000 | ORAL_CAPSULE | Freq: Three times a day (TID) | ORAL | 3 refills | Status: DC | PRN

## 2018-09-27 MED ORDER — TRAMADOL HCL 50 MG PO TABS: 50.0000 mg | ORAL_TABLET | Freq: Two times a day (BID) | ORAL | 3 refills | Status: DC | PRN

## 2018-09-27 NOTE — Progress Notes (Signed)
Subjective:  Patient ID: Bradley Conner, male    DOB: 20-Feb-1956  Age: 62 y.o. MRN: [id number]  CC: Hypertension and Osteoarthritis   HPI Bradley Conner presents for a BP check - He does not monitor his blood pressure.  He denies any recent episodes of headache/blurred vision/CP/DOE/palpitations/edema/fatigue.  He complains of chronic joint pain in his hips and knees.  He is followed by orthopedics and says sometime in the last year he had a steroid injection in the left hip that sounds like it was a treatment for trochanteric bursitis.  He has hip pain that keeps him awake at night.  He gets some symptom relief with diclofenac but he does not take it all that frequently.  Outpatient Medications Prior to Visit  Medication Sig Dispense Refill  . omeprazole (PRILOSEC) 20 MG capsule TAKE 1 CAPSULE BY MOUTH  DAILY 30 MINUTES BEFORE A  MEAL 90 capsule 1  . valACYclovir (VALTREX) 1000 MG tablet TAKE 1 TABLET BY MOUTH TWO  TIMES DAILY 90 tablet 2  . Diclofenac (ZORVOLEX) 35 MG CAPS Take 1 capsule by mouth 3 (three) times daily with meals as needed. 90 capsule 3  . lisinopril (PRINIVIL,ZESTRIL) 40 MG tablet TAKE 1 TABLET BY MOUTH  DAILY 90 tablet 1   No facility-administered medications prior to visit.     ROS Review of Systems  Constitutional: Negative for diaphoresis and fatigue.  HENT: Negative.  Negative for trouble swallowing and voice change.   Eyes: Negative for visual disturbance.  Respiratory: Negative for cough, chest tightness, shortness of breath and wheezing.   Gastrointestinal: Negative for abdominal pain, constipation, diarrhea, nausea and vomiting.  Endocrine: Negative.   Genitourinary: Negative.  Negative for difficulty urinating.  Musculoskeletal: Positive for arthralgias and back pain. Negative for neck pain.  Skin: Negative.  Negative for color change.  Neurological: Negative for dizziness, weakness and light-headedness.  Hematological: Negative for adenopathy. Does not  bruise/bleed easily.  Psychiatric/Behavioral: Negative.     Objective:  BP (!) 144/96 (BP Location: Left Arm, Patient Position: Sitting, Cuff Size: Large)   Pulse 60   Temp 98.2 F (36.8 C) (Oral)   Resp 16   Ht 5\' 8"  (1.727 m)   Wt 211 lb (95.7 kg)   SpO2 95%   BMI 32.08 kg/m   BP Readings from Last 3 Encounters:  09/27/18 (!) 144/96  03/28/18 130/80  10/19/17 114/82    Wt Readings from Last 3 Encounters:  09/27/18 211 lb (95.7 kg)  03/28/18 209 lb 8 oz (95 kg)  10/19/17 208 lb (94.3 kg)    Physical Exam  Constitutional: He is oriented to person, place, and time. No distress.  HENT:  Mouth/Throat: Oropharynx is clear and moist. No oropharyngeal exudate.  Eyes: Conjunctivae are normal. No scleral icterus.  Neck: Normal range of motion. Neck supple. No JVD present. No thyromegaly present.  Cardiovascular: Normal rate, regular rhythm and normal heart sounds. Exam reveals no gallop.  No murmur heard. Pulmonary/Chest: Effort normal and breath sounds normal. No respiratory distress. He has no wheezes. He has no rhonchi. He has no rales.  Abdominal: Soft. Bowel sounds are normal. He exhibits no mass. There is no hepatosplenomegaly. There is no tenderness.  Musculoskeletal: Normal range of motion. He exhibits no edema, tenderness or deformity.  Lymphadenopathy:    He has no cervical adenopathy.  Neurological: He is alert and oriented to person, place, and time.  Skin: Skin is warm and dry. No rash noted. He is not diaphoretic.  Vitals reviewed.   Lab Results  Component Value Date   WBC 6.6 09/27/2018   HGB 16.9 09/27/2018   HCT 49.4 09/27/2018   PLT 225.0 09/27/2018   GLUCOSE 112 (H) 09/27/2018   CHOL 156 03/28/2018   TRIG 177.0 (H) 03/28/2018   HDL 19.50 (L) 03/28/2018   LDLDIRECT 80.0 03/23/2017   LDLCALC 101 (H) 03/28/2018   ALT 26 03/28/2018   AST 16 03/28/2018   NA 137 09/27/2018   K 4.4 09/27/2018   CL 104 09/27/2018   CREATININE 1.17 09/27/2018   BUN 13  09/27/2018   CO2 27 09/27/2018   TSH 1.66 03/23/2017   PSA 1.13 03/28/2018   HGBA1C 5.5 09/27/2018    Dg Hip Unilat With Pelvis 2-3 Views Left  Result Date: 03/28/2018 CLINICAL DATA:  Left hip pain and left leg numbness intermittently for the past 6 months. No known injury. EXAM: DG HIP (WITH OR WITHOUT PELVIS) 2-3V LEFT COMPARISON:  Coronal and sagittal reconstructed images through the pelvis and left hip from a CT scan of December 02, 2013 FINDINGS: The bones are subjectively adequately mineralized. The hip joint space is well maintained. The articular surfaces of the left femoral head and acetabulum remains smoothly rounded. The femoral neck, intertrochanteric, and subtrochanteric regions are normal. IMPRESSION: There is no acute or significant chronic bony abnormality of the left hip. Electronically Signed   By: Bradley  Conner M.D.   On: 03/28/2018 08:53    Assessment & Plan:   Bradley Conner was seen today for hypertension and osteoarthritis.  Diagnoses and all orders for this visit:  Chronic left hip pain- I asked him to follow-up with orthopedics about this. -     Ambulatory referral to Orthopedic Surgery  Hyperglycemia- His average blood sugar is normal with an A1c of 5.5%. -     Basic metabolic panel; Future -     Hemoglobin A1c; Future  Essential hypertension- His blood pressure is not adequately well controlled.  Electrolytes and renal function are normal.  I will upgrade him from an ACE inhibitor to an ARB. -     Azilsartan Medoxomil (EDARBI) 80 MG TABS; Take 1 tablet (80 mg total) by mouth daily. -     Basic metabolic panel; Future -     CBC with Differential/Platelet; Future  Primary osteoarthritis involving multiple joints- He has pain that interferes with his sleep and daily activities.  He will continue diclofenac as needed but also recommended that he take tramadol. -     traMADol (ULTRAM) 50 MG tablet; Take 1 tablet (50 mg total) by mouth every 12 (twelve) hours as needed. -      Diclofenac (ZORVOLEX) 35 MG CAPS; Take 1 capsule by mouth 3 (three) times daily with meals as needed.  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 -     Diclofenac (ZORVOLEX) 35 MG CAPS; Take 1 capsule by mouth 3 (three) times daily with meals as needed.   I have discontinued Nashton W. Zukas's lisinopril. I am also having him start on Azilsartan Medoxomil and traMADol. Additionally, I am having him maintain his omeprazole, valACYclovir, and Diclofenac.  Meds ordered this encounter  Medications  . Azilsartan Medoxomil (EDARBI) 80 MG TABS    Sig: Take 1 tablet (80 mg total) by mouth daily.    Dispense:  90 tablet    Refill:  1  .  traMADol (ULTRAM) 50 MG tablet    Sig: Take 1 tablet (50 mg total) by mouth every 12 (twelve) hours as needed.    Dispense:  60 tablet    Refill:  3  . Diclofenac (ZORVOLEX) 35 MG CAPS    Sig: Take 1 capsule by mouth 3 (three) times daily with meals as needed.    Dispense:  90 capsule    Refill:  3     Follow-up: Return in about 4 months (around 01/27/2019).  Scarlette Calico, MD

## 2018-09-27 NOTE — Patient Instructions (Signed)

## 2018-10-02 ENCOUNTER — Other Ambulatory Visit: Payer: Self-pay | Admitting: Internal Medicine

## 2018-10-02 ENCOUNTER — Telehealth: Payer: Self-pay | Admitting: Internal Medicine

## 2018-10-02 DIAGNOSIS — I10 Essential (primary) hypertension: Secondary | ICD-10-CM

## 2018-10-02 MED ORDER — CANDESARTAN CILEXETIL 32 MG PO TABS: 32.0000 mg | ORAL_TABLET | Freq: Every day | ORAL | 1 refills | Status: DC

## 2018-10-02 NOTE — Telephone Encounter (Signed)
Copied from [place] [id number]. Topic: Quick Communication - See Telephone Encounter >> Oct 02, 2018 11:39 AM Blase Mess A wrote: CRM for notification. See Telephone encounter for: 10/02/18. Patient is calling regarding his ilsartan Medoxomil (EDARBI) 80 MG TABS [[id number]]  The insurance is stating that the medication is over $700.   Patient is stating that he can double up on his  Lisinopril. Please advise

## 2018-10-02 NOTE — Telephone Encounter (Signed)
Changed to a different option

## 2018-10-02 NOTE — Telephone Encounter (Signed)
LVM informing pt

## 2018-10-04 NOTE — Telephone Encounter (Signed)
Pt states the pharmacy stated some generics that may be more cost effective that can be ordered for the pt are eprosartan, irbesartan, losartan, telmisartan, and benicar.

## 2018-10-04 NOTE — Telephone Encounter (Signed)
Pt states he cannot afford the new blood pressure medicine that has been sent in as well. It will be $85 per mont with insurance. Please advise.

## 2018-10-09 ENCOUNTER — Other Ambulatory Visit: Payer: Self-pay | Admitting: Internal Medicine

## 2018-10-09 DIAGNOSIS — I10 Essential (primary) hypertension: Secondary | ICD-10-CM

## 2018-10-09 MED ORDER — IRBESARTAN 300 MG PO TABS: 300.0000 mg | ORAL_TABLET | Freq: Every day | ORAL | 1 refills | Status: DC

## 2018-10-09 NOTE — Telephone Encounter (Signed)
Pt is requesting to change from [place] due to cost. Pharmacy has irbesartan, losartan and telmisartan available.

## 2018-10-10 NOTE — Telephone Encounter (Signed)
Irbesartan was sent in.

## 2018-11-17 DIAGNOSIS — M25511 Pain in right shoulder: Secondary | ICD-10-CM | POA: Insufficient documentation

## 2019-02-19 ENCOUNTER — Other Ambulatory Visit: Payer: Self-pay | Admitting: Internal Medicine

## 2019-04-26 ENCOUNTER — Telehealth: Payer: Self-pay

## 2019-04-26 ENCOUNTER — Other Ambulatory Visit: Payer: Self-pay | Admitting: Internal Medicine

## 2019-04-26 DIAGNOSIS — I10 Essential (primary) hypertension: Secondary | ICD-10-CM

## 2019-04-26 MED ORDER — IRBESARTAN 300 MG PO TABS: 300.0000 mg | ORAL_TABLET | Freq: Every day | ORAL | 0 refills | Status: DC

## 2019-04-26 NOTE — Telephone Encounter (Signed)
Pt has scheduled follow up visit for 05/07/2019.   Please advise if bp rx can be sent in.

## 2019-04-26 NOTE — Telephone Encounter (Signed)
Refill request for irbesartan -   Per PCP - pt is due for an appointment.

## 2019-05-07 ENCOUNTER — Other Ambulatory Visit: Payer: Self-pay

## 2019-05-07 ENCOUNTER — Other Ambulatory Visit (INDEPENDENT_AMBULATORY_CARE_PROVIDER_SITE_OTHER): Payer: 59

## 2019-05-07 ENCOUNTER — Encounter: Payer: Self-pay | Admitting: Internal Medicine

## 2019-05-07 ENCOUNTER — Ambulatory Visit (INDEPENDENT_AMBULATORY_CARE_PROVIDER_SITE_OTHER): Payer: 59 | Admitting: Internal Medicine

## 2019-05-07 VITALS — BP 162/96 | HR 64 | Temp 98.4°F | Resp 16 | Ht 68.0 in | Wt 207.0 lb

## 2019-05-07 DIAGNOSIS — M150 Primary generalized (osteo)arthritis: Secondary | ICD-10-CM

## 2019-05-07 DIAGNOSIS — Z0000 Encounter for general adult medical examination without abnormal findings: Secondary | ICD-10-CM

## 2019-05-07 DIAGNOSIS — E782 Mixed hyperlipidemia: Secondary | ICD-10-CM | POA: Diagnosis not present

## 2019-05-07 DIAGNOSIS — I10 Essential (primary) hypertension: Secondary | ICD-10-CM

## 2019-05-07 DIAGNOSIS — R739 Hyperglycemia, unspecified: Secondary | ICD-10-CM

## 2019-05-07 DIAGNOSIS — M5106 Intervertebral disc disorders with myelopathy, lumbar region: Secondary | ICD-10-CM

## 2019-05-07 DIAGNOSIS — M159 Polyosteoarthritis, unspecified: Secondary | ICD-10-CM

## 2019-05-07 DIAGNOSIS — E785 Hyperlipidemia, unspecified: Secondary | ICD-10-CM

## 2019-05-07 LAB — URINALYSIS, ROUTINE W REFLEX MICROSCOPIC
Bilirubin Urine: NEGATIVE
Hgb urine dipstick: NEGATIVE
Ketones, ur: NEGATIVE
Leukocytes,Ua: NEGATIVE
Nitrite: NEGATIVE
RBC / HPF: NONE SEEN (ref 0–?)
Specific Gravity, Urine: <=1.005 — AB (ref 1.000–1.030)
Total Protein, Urine: NEGATIVE
Urine Glucose: NEGATIVE
Urobilinogen, UA: 0.2 (ref 0.0–1.0)
pH: 6 (ref 5.0–8.0)

## 2019-05-07 LAB — CBC WITH DIFFERENTIAL/PLATELET
Basophils Absolute: 0.1 10*3/uL (ref 0.0–0.1)
Basophils Relative: 0.9 % (ref 0.0–3.0)
Eosinophils Absolute: 0.3 10*3/uL (ref 0.0–0.7)
Eosinophils Relative: 4.1 % (ref 0.0–5.0)
HCT: 47.3 % (ref 39.0–52.0)
Hemoglobin: 16.1 g/dL (ref 13.0–17.0)
Lymphocytes Relative: 31 % (ref 12.0–46.0)
Lymphs Abs: 1.9 10*3/uL (ref 0.7–4.0)
MCHC: 34 g/dL (ref 30.0–36.0)
MCV: 86.2 fl (ref 78.0–100.0)
Monocytes Absolute: 0.6 10*3/uL (ref 0.1–1.0)
Monocytes Relative: 9.7 % (ref 3.0–12.0)
Neutro Abs: 3.3 10*3/uL (ref 1.4–7.7)
Neutrophils Relative %: 54.3 % (ref 43.0–77.0)
Platelets: 210 10*3/uL (ref 150.0–400.0)
RBC: 5.49 Mil/uL (ref 4.22–5.81)
RDW: 13.1 % (ref 11.5–15.5)
WBC: 6.2 10*3/uL (ref 4.0–10.5)

## 2019-05-07 LAB — LIPID PANEL
Cholesterol: 126 mg/dL (ref 0–200)
HDL: 20.2 mg/dL — ABNORMAL LOW (ref >39.00)
LDL Cholesterol: 70 mg/dL (ref 0–99)
NonHDL: 106.26
Total CHOL/HDL Ratio: 6
Triglycerides: 180 mg/dL — ABNORMAL HIGH (ref 0.0–149.0)
VLDL: 36 mg/dL (ref 0.0–40.0)

## 2019-05-07 LAB — HEMOGLOBIN A1C: Hgb A1c MFr Bld: 5.4 % (ref 4.6–6.5)

## 2019-05-07 LAB — BASIC METABOLIC PANEL
BUN: 17 mg/dL (ref 6–23)
CO2: 26 mEq/L (ref 19–32)
Calcium: 9 mg/dL (ref 8.4–10.5)
Chloride: 105 mEq/L (ref 96–112)
Creatinine, Ser: 1.09 mg/dL (ref 0.40–1.50)
GFR: 68.75 mL/min (ref >60.00)
Glucose, Bld: 108 mg/dL — ABNORMAL HIGH (ref 70–99)
Potassium: 4 mEq/L (ref 3.5–5.1)
Sodium: 140 mEq/L (ref 135–145)

## 2019-05-07 LAB — PSA: PSA: 0.92 ng/mL (ref 0.10–4.00)

## 2019-05-07 LAB — HEPATIC FUNCTION PANEL
ALT: 30 U/L (ref 0–53)
AST: 17 U/L (ref 0–37)
Albumin: 4.4 g/dL (ref 3.5–5.2)
Alkaline Phosphatase: 73 U/L (ref 39–117)
Bilirubin, Direct: 0.1 mg/dL (ref 0.0–0.3)
Total Bilirubin: 0.7 mg/dL (ref 0.2–1.2)
Total Protein: 6.3 g/dL (ref 6.0–8.3)

## 2019-05-07 LAB — TSH: TSH: 1.86 u[IU]/mL (ref 0.35–4.50)

## 2019-05-07 MED ORDER — TRAMADOL HCL 50 MG PO TABS: 50.0000 mg | ORAL_TABLET | Freq: Two times a day (BID) | ORAL | 3 refills | Status: DC | PRN

## 2019-05-07 MED ORDER — ROSUVASTATIN CALCIUM 5 MG PO TABS: 5.0000 mg | ORAL_TABLET | Freq: Every day | ORAL | 1 refills | Status: AC

## 2019-05-07 MED ORDER — INDAPAMIDE 1.25 MG PO TABS: 1.2500 mg | ORAL_TABLET | Freq: Every day | ORAL | 0 refills | Status: DC

## 2019-05-07 NOTE — Progress Notes (Signed)
Subjective:  Patient ID: Bradley Conner, male    DOB: 04/15/1956  Age: 63 y.o. MRN: [id number]  CC: Annual Exam and Hypertension   HPI Bradley Conner presents for a CPX.  He does not monitor his blood pressure.  He is active and denies any recent episodes of CP, DOE, palpitations, edema, or fatigue.  He has been able to lose some weight since I last saw him with lifestyle modifications.  He tells me he is compliant with the ARB.  He complains of arthralgias in his large joints.  He takes ibuprofen intermittently but says it does not provide much relief from the pain.  Outpatient Medications Prior to Visit  Medication Sig Dispense Refill  . irbesartan (AVAPRO) 300 MG tablet Take 1 tablet (300 mg total) by mouth daily. 30 tablet 0  . omeprazole (PRILOSEC) 20 MG capsule TAKE 1 CAPSULE BY MOUTH  DAILY 30 MINUTES BEFORE A  MEAL 90 capsule 1  . valACYclovir (VALTREX) 1000 MG tablet TAKE 1 TABLET BY MOUTH TWO  TIMES DAILY (Patient not taking: Reported on 05/07/2019) 90 tablet 2  . Diclofenac (ZORVOLEX) 35 MG CAPS Take 1 capsule by mouth 3 (three) times daily with meals as needed. (Patient not taking: Reported on 05/07/2019) 90 capsule 3  . traMADol (ULTRAM) 50 MG tablet Take 1 tablet (50 mg total) by mouth every 12 (twelve) hours as needed. (Patient not taking: Reported on 05/07/2019) 60 tablet 3   No facility-administered medications prior to visit.     ROS Review of Systems  Constitutional: Negative.  Negative for diaphoresis, fatigue and unexpected weight change.  HENT: Negative.  Negative for trouble swallowing.   Eyes: Negative for visual disturbance.  Respiratory: Negative for cough, chest tightness, shortness of breath and wheezing.   Cardiovascular: Negative for chest pain, palpitations and leg swelling.  Gastrointestinal: Negative for abdominal pain, constipation, diarrhea, nausea and vomiting.  Endocrine: Negative.   Genitourinary: Negative.  Negative for difficulty urinating,  penile swelling, scrotal swelling, testicular pain and urgency.  Musculoskeletal: Positive for arthralgias and back pain. Negative for myalgias.  Skin: Negative.  Negative for color change.  Neurological: Negative.  Negative for dizziness, weakness, light-headedness and headaches.  Hematological: Negative for adenopathy. Does not bruise/bleed easily.  Psychiatric/Behavioral: Negative.     Objective:  BP (!) 162/96 (BP Location: Left Arm, Patient Position: Sitting, Cuff Size: Normal) Comment: BP (R) 162/96 (L) 156/92  Pulse 64   Temp 98.4 F (36.9 C) (Oral)   Resp 16   Ht 5\' 8"  (1.727 m)   Wt 207 lb (93.9 kg)   SpO2 98%   BMI 31.47 kg/m   BP Readings from Last 3 Encounters:  05/07/19 (!) 162/96  09/27/18 (!) 144/96  03/28/18 130/80    Wt Readings from Last 3 Encounters:  05/07/19 207 lb (93.9 kg)  09/27/18 211 lb (95.7 kg)  03/28/18 209 lb 8 oz (95 kg)    Physical Exam Vitals signs reviewed.  Constitutional:      Appearance: He is obese. He is not ill-appearing or diaphoretic.  HENT:     Nose: Nose normal.     Mouth/Throat:     Mouth: Mucous membranes are moist.     Pharynx: Oropharynx is clear.  Eyes:     General: No scleral icterus.    Conjunctiva/sclera: Conjunctivae normal.  Neck:     Musculoskeletal: Normal range of motion. No neck rigidity.  Cardiovascular:     Rate and Rhythm: Normal rate and regular rhythm.  Heart sounds: No murmur.     Comments: EKG ----  Sinus  Rhythm  WITHIN NORMAL LIMITS  Pulmonary:     Effort: Pulmonary effort is normal.     Breath sounds: No stridor. No wheezing, rhonchi or rales.  Abdominal:     General: Abdomen is protuberant. Bowel sounds are normal. There is no distension.     Palpations: There is no hepatomegaly or splenomegaly.     Tenderness: There is no abdominal tenderness.     Hernia: No hernia is present.  Genitourinary:    Pubic Area: No rash.      Penis: Normal and circumcised. No discharge, swelling or  lesions.      Prostate: Normal. Not enlarged, not tender and no nodules present.     Rectum: Normal. Guaiac result negative. No mass, tenderness, anal fissure, external hemorrhoid or internal hemorrhoid. Normal anal tone.  Musculoskeletal: Normal range of motion.     Right lower leg: No edema.     Left lower leg: No edema.  Skin:    General: Skin is warm and dry.     Coloration: Skin is not pale.  Neurological:     General: No focal deficit present.     Mental Status: He is alert and oriented to person, place, and time. Mental status is at baseline.  Psychiatric:        Mood and Affect: Mood normal.        Behavior: Behavior normal.     Lab Results  Component Value Date   WBC 6.2 05/07/2019   HGB 16.1 05/07/2019   HCT 47.3 05/07/2019   PLT 210.0 05/07/2019   GLUCOSE 108 (H) 05/07/2019   CHOL 126 05/07/2019   TRIG 180.0 (H) 05/07/2019   HDL 20.20 (L) 05/07/2019   LDLDIRECT 80.0 03/23/2017   LDLCALC 70 05/07/2019   ALT 30 05/07/2019   AST 17 05/07/2019   NA 140 05/07/2019   K 4.0 05/07/2019   CL 105 05/07/2019   CREATININE 1.09 05/07/2019   BUN 17 05/07/2019   CO2 26 05/07/2019   TSH 1.86 05/07/2019   PSA 0.92 05/07/2019   HGBA1C 5.4 05/07/2019    Dg Hip Unilat With Pelvis 2-3 Views Left  Result Date: 03/28/2018 CLINICAL DATA:  Left hip pain and left leg numbness intermittently for the past 6 months. No known injury. EXAM: DG HIP (WITH OR WITHOUT PELVIS) 2-3V LEFT COMPARISON:  Coronal and sagittal reconstructed images through the pelvis and left hip from a CT scan of December 02, 2013 FINDINGS: The bones are subjectively adequately mineralized. The hip joint space is well maintained. The articular surfaces of the left femoral head and acetabulum remains smoothly rounded. The femoral neck, intertrochanteric, and subtrochanteric regions are normal. IMPRESSION: There is no acute or significant chronic bony abnormality of the left hip. Electronically Signed   By: David  Martinique  M.D.   On: 03/28/2018 08:53    Assessment & Plan:   Kelechi was seen today for annual exam and hypertension.  Diagnoses and all orders for this visit:  Essential hypertension- His blood pressure is not adequately well controlled.  EKG is negative for LVH or ischemia.  Labs are negative for secondary causes or endorgan damage.  I have asked him to avoid NSAIDs.  I have asked him to add a thiazide diuretic to the ARB. -     CBC with Differential/Platelet; Future -     Basic metabolic panel; Future -     TSH; Future -  Urinalysis, Routine w reflex microscopic; Future -     indapamide (LOZOL) 1.25 MG tablet; Take 1 tablet (1.25 mg total) by mouth daily. -     EKG 12-Lead  Routine general medical examination at a health care facility- Exam completed, labs reviewed, vaccines reviewed, screening for colon cancer is up-to-date, patient education material was given. -     Lipid panel; Future -     PSA; Future -     HIV Antibody (routine testing w rflx); Future  Mixed hyperlipidemia -     Hepatic function panel; Future  Chronic hyperglycemia -     Hemoglobin A1c; Future  Primary osteoarthritis involving multiple joints -     traMADol (ULTRAM) 50 MG tablet; Take 1 tablet (50 mg total) by mouth every 12 (twelve) hours as needed.  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  Hyperlipidemia LDL goal <70- He has an elevated ASCVD risk score so I have asked him to start taking a statin for CV risk reduction. -     rosuvastatin (CRESTOR) 5 MG tablet; Take 1 tablet (5 mg total) by mouth daily.   I have discontinued Sevag W. Weldin's Diclofenac. I am also having him start on indapamide and rosuvastatin. Additionally, I am having him maintain his valACYclovir, omeprazole, irbesartan, and traMADol.  Meds ordered this encounter  Medications  . indapamide (LOZOL) 1.25 MG  tablet    Sig: Take 1 tablet (1.25 mg total) by mouth daily.    Dispense:  90 tablet    Refill:  0  . traMADol (ULTRAM) 50 MG tablet    Sig: Take 1 tablet (50 mg total) by mouth every 12 (twelve) hours as needed.    Dispense:  60 tablet    Refill:  3  . rosuvastatin (CRESTOR) 5 MG tablet    Sig: Take 1 tablet (5 mg total) by mouth daily.    Dispense:  90 tablet    Refill:  1     Follow-up: Return in about 3 months (around 08/07/2019).  Scarlette Calico, MD

## 2019-05-07 NOTE — Patient Instructions (Signed)

## 2019-05-08 ENCOUNTER — Encounter: Payer: Self-pay | Admitting: Internal Medicine

## 2019-05-08 LAB — HIV ANTIBODY (ROUTINE TESTING W REFLEX): HIV 1&2 Ab, 4th Generation: NONREACTIVE

## 2019-05-15 ENCOUNTER — Ambulatory Visit: Payer: Self-pay | Admitting: *Deleted

## 2019-05-15 NOTE — Telephone Encounter (Signed)
Pt called with having some dizziness after starting on 2 medications prescribed on 07/13. They are indapamide and rosuvastatin. He has tried taking them after he eats and still  has some dizziness. He denies vertigo or any other symptoms. He is wanting advice from his pcp. He would like a call back. Routing to [facility] at [facility] for review and recommendation.   Reason for Disposition . [1] Caller has NON-URGENT medication question about med that PCP prescribed AND [2] triager unable to answer question  Answer Assessment - Initial Assessment Questions 1.   NAME of MEDICATION: "What medicine are you calling about?"     Indapamide (Lozol) and rosuvastatin (crestor) 2.   QUESTION: "What is your question?"     Could this medication make him dizzy? 3.   PRESCRIBING HCP: "Who prescribed it?" Reason: if prescribed by specialist, call should be referred to that group.     Dr Ronnald Ramp 4. SYMPTOMS: "Do you have any symptoms?"     yes 5. SEVERITY: If symptoms are present, ask "Are they mild, moderate or severe?"     Mild to moderate, dizziness 6.  PREGNANCY:  "Is there any chance that you are pregnant?" "When was your last menstrual period?"     N/a  Protocols used: MEDICATION QUESTION CALL-A-AH

## 2019-05-15 NOTE — Telephone Encounter (Signed)
Ask him to check his blood pressure or come let us check his blood pressure.  With new blood pressure medicines there can be some dizziness but it usually gets better over time.  If it is a deal breaker then he should let me know.  I do not think the rosuvastatin is causing this. I think it might be the indapamide.

## 2019-05-15 NOTE — Telephone Encounter (Signed)
Pt is having dizziness after taking the indapamide and irbesartan. Pt has tried taking it before he eats and on an empty stomach. Pt still has the dizziness. Please advise and let me know if you need to see pt again.

## 2019-05-15 NOTE — Telephone Encounter (Signed)
LVM for pt to call back,   Okay to schedule pt for nurse visit or visit with PCP.  Okay to read pt PCP note below.

## 2019-05-18 ENCOUNTER — Telehealth: Payer: Self-pay | Admitting: Internal Medicine

## 2019-05-18 NOTE — Telephone Encounter (Signed)
Pt returned [place] call. Relayed message from provider, pt expressed understanding. Pt says that he will see how things go over the weekend and he will call if an apt is needed.

## 2019-06-06 ENCOUNTER — Other Ambulatory Visit: Payer: Self-pay | Admitting: Internal Medicine

## 2019-06-06 DIAGNOSIS — I10 Essential (primary) hypertension: Secondary | ICD-10-CM

## 2019-06-06 MED ORDER — IRBESARTAN 300 MG PO TABS: 300.0000 mg | ORAL_TABLET | Freq: Every day | ORAL | 1 refills | Status: DC

## 2019-08-17 ENCOUNTER — Telehealth: Payer: Self-pay

## 2019-08-17 NOTE — Telephone Encounter (Signed)
Refill request for indapamide from [facility] on [address].  Pt is past due for an appt.   lvm for pt to call back and sched appt.   Refill can be sent once appt has been made.

## 2019-10-17 ENCOUNTER — Other Ambulatory Visit: Payer: Self-pay | Admitting: Internal Medicine

## 2019-12-09 ENCOUNTER — Other Ambulatory Visit: Payer: Self-pay | Admitting: Internal Medicine

## 2019-12-09 DIAGNOSIS — I10 Essential (primary) hypertension: Secondary | ICD-10-CM

## 2019-12-23 ENCOUNTER — Other Ambulatory Visit: Payer: Self-pay | Admitting: Internal Medicine

## 2020-06-23 ENCOUNTER — Other Ambulatory Visit: Payer: Self-pay | Admitting: Internal Medicine

## 2020-06-23 DIAGNOSIS — I10 Essential (primary) hypertension: Secondary | ICD-10-CM

## 2020-06-24 ENCOUNTER — Other Ambulatory Visit: Payer: Self-pay | Admitting: Internal Medicine

## 2020-06-24 DIAGNOSIS — I10 Essential (primary) hypertension: Secondary | ICD-10-CM

## 2020-06-26 ENCOUNTER — Ambulatory Visit (INDEPENDENT_AMBULATORY_CARE_PROVIDER_SITE_OTHER): Payer: No Typology Code available for payment source | Admitting: Internal Medicine

## 2020-06-26 ENCOUNTER — Other Ambulatory Visit: Payer: Self-pay

## 2020-06-26 ENCOUNTER — Encounter: Payer: Self-pay | Admitting: Internal Medicine

## 2020-06-26 VITALS — BP 142/92 | HR 74 | Temp 98.2°F | Resp 16 | Ht 68.0 in | Wt 218.0 lb

## 2020-06-26 DIAGNOSIS — R739 Hyperglycemia, unspecified: Secondary | ICD-10-CM

## 2020-06-26 DIAGNOSIS — I10 Essential (primary) hypertension: Secondary | ICD-10-CM

## 2020-06-26 DIAGNOSIS — Z0000 Encounter for general adult medical examination without abnormal findings: Secondary | ICD-10-CM

## 2020-06-26 DIAGNOSIS — E785 Hyperlipidemia, unspecified: Secondary | ICD-10-CM

## 2020-06-26 DIAGNOSIS — Z23 Encounter for immunization: Secondary | ICD-10-CM

## 2020-06-26 NOTE — Progress Notes (Signed)
Subjective:  Patient ID: Bradley Conner, male    DOB: 08-14-1956  Age: 64 y.o. MRN: [id number]  CC: Annual Exam, Hypertension, and Hyperlipidemia  This visit occurred during the SARS-CoV-2 public health emergency.  Safety protocols were in place, including screening questions prior to the visit, additional usage of staff PPE, and extensive cleaning of exam room while observing appropriate contact time as indicated for disinfecting solutions.    HPI Bradley Conner presents for a CPX.  He tells me his blood pressure has been well controlled.  He is not taking indapamide because it made him feel dizzy.  He is active and denies any recent episodes of headache, blurred vision, chest pain, shortness of breath, palpitations, edema, or fatigue.  Outpatient Medications Prior to Visit  Medication Sig Dispense Refill  . irbesartan (AVAPRO) 300 MG tablet TAKE 1 TABLET(300 MG) BY MOUTH DAILY 90 tablet 0  . omeprazole (PRILOSEC) 20 MG capsule TAKE 1 CAPSULE BY MOUTH  DAILY 30 MINUTES BEFORE A  MEAL 90 capsule 1  . rosuvastatin (CRESTOR) 5 MG tablet Take 1 tablet (5 mg total) by mouth daily. 90 tablet 1  . valACYclovir (VALTREX) 1000 MG tablet TAKE 1 TABLET BY MOUTH TWO  TIMES DAILY 90 tablet 2  . indapamide (LOZOL) 1.25 MG tablet Take 1 tablet (1.25 mg total) by mouth daily. (Patient not taking: Reported on 06/26/2020) 90 tablet 0  . traMADol (ULTRAM) 50 MG tablet Take 1 tablet (50 mg total) by mouth every 12 (twelve) hours as needed. (Patient not taking: Reported on 06/26/2020) 60 tablet 3   No facility-administered medications prior to visit.    ROS Review of Systems  Constitutional: Negative.  Negative for appetite change, diaphoresis, fatigue and unexpected weight change.  HENT: Negative.   Eyes: Negative for visual disturbance.  Respiratory: Negative for cough, chest tightness, shortness of breath and wheezing.   Cardiovascular: Negative for chest pain, palpitations and leg swelling.    Gastrointestinal: Negative for abdominal pain, constipation, diarrhea, nausea and vomiting.  Endocrine: Negative.   Genitourinary: Negative.  Negative for difficulty urinating, dysuria, penile swelling, scrotal swelling, testicular pain and urgency.  Musculoskeletal: Negative for arthralgias and myalgias.  Skin: Negative.  Negative for color change.  Neurological: Negative.  Negative for dizziness, weakness, light-headedness and numbness.  Hematological: Negative for adenopathy. Does not bruise/bleed easily.  Psychiatric/Behavioral: Negative.     Objective:  BP (!) 142/92   Pulse 74   Temp 98.2 F (36.8 C) (Oral)   Resp 16   Ht 5\' 8"  (1.727 m)   Wt 218 lb (98.9 kg)   SpO2 96%   BMI 33.15 kg/m   BP Readings from Last 3 Encounters:  06/26/20 (!) 142/92  05/07/19 (!) 162/96  09/27/18 (!) 144/96    Wt Readings from Last 3 Encounters:  06/26/20 218 lb (98.9 kg)  05/07/19 207 lb (93.9 kg)  09/27/18 211 lb (95.7 kg)    Physical Exam Vitals reviewed.  Constitutional:      Appearance: Normal appearance.  HENT:     Nose: Nose normal.     Mouth/Throat:     Mouth: Mucous membranes are moist.  Eyes:     General: No scleral icterus.    Conjunctiva/sclera: Conjunctivae normal.  Cardiovascular:     Rate and Rhythm: Regular rhythm. Bradycardia present.     Heart sounds: No murmur heard.  No gallop.      Comments: EKG - Sinus bradycardia No LVH or Q waves Normal EKG Pulmonary:  Effort: Pulmonary effort is normal.     Breath sounds: No stridor. No wheezing, rhonchi or rales.  Abdominal:     General: Abdomen is protuberant. There is no distension.     Palpations: There is no mass.     Tenderness: There is no abdominal tenderness. There is no guarding or rebound.     Hernia: A hernia is present. Hernia is present in the ventral area. There is no hernia in the left inguinal area or right inguinal area.  Genitourinary:    Pubic Area: No rash.      Penis: Normal.       Testes: Normal.        Right: Mass not present.        Left: Mass not present.     Epididymis:     Right: Normal.     Left: Normal.     Prostate: Enlarged (1+ smooth symm BPH). Not tender and no nodules present.     Rectum: Guaiac result negative. External hemorrhoid present. No mass, tenderness, anal fissure or internal hemorrhoid. Normal anal tone.  Musculoskeletal:        General: Normal range of motion.     Cervical back: Neck supple.     Right lower leg: No edema.     Left lower leg: No edema.  Lymphadenopathy:     Cervical: No cervical adenopathy.     Lower Body: No right inguinal adenopathy. No left inguinal adenopathy.  Skin:    General: Skin is warm and dry.     Coloration: Skin is not pale.  Neurological:     General: No focal deficit present.     Mental Status: He is alert.  Psychiatric:        Mood and Affect: Mood normal.        Behavior: Behavior normal.     Lab Results  Component Value Date   WBC 8.7 06/26/2020   HGB 16.6 06/26/2020   HCT 49.6 06/26/2020   PLT 240 06/26/2020   GLUCOSE 85 06/26/2020   CHOL 140 06/26/2020   TRIG 184 (H) 06/26/2020   HDL 22 (L) 06/26/2020   LDLDIRECT 80.0 03/23/2017   LDLCALC 90 06/26/2020   ALT 22 06/26/2020   AST 18 06/26/2020   NA 139 06/26/2020   K 4.5 06/26/2020   CL 103 06/26/2020   CREATININE 1.25 06/26/2020   BUN 15 06/26/2020   CO2 26 06/26/2020   TSH 1.83 06/26/2020   PSA 1.2 06/26/2020   HGBA1C 5.3 06/26/2020    DG HIP UNILAT WITH PELVIS 2-3 VIEWS LEFT  Result Date: 03/28/2018 CLINICAL DATA:  Left hip pain and left leg numbness intermittently for the past 6 months. No known injury. EXAM: DG HIP (WITH OR WITHOUT PELVIS) 2-3V LEFT COMPARISON:  Coronal and sagittal reconstructed images through the pelvis and left hip from a CT scan of December 02, 2013 FINDINGS: The bones are subjectively adequately mineralized. The hip joint space is well maintained. The articular surfaces of the left femoral head and  acetabulum remains smoothly rounded. The femoral neck, intertrochanteric, and subtrochanteric regions are normal. IMPRESSION: There is no acute or significant chronic bony abnormality of the left hip. Electronically Signed   By: David  Martinique M.D.   On: 03/28/2018 08:53    Assessment & Plan:   [place] was seen today for annual exam, hypertension and hyperlipidemia.  Diagnoses and all orders for this visit:  Essential hypertension- His blood pressure is not adequately well controlled.  Labs  are negative for secondary causes or endorgan damage.  He is not willing to take a thiazide diuretic.  Will continue the ARB.  I encouraged him to improve his lifestyle modifications. -     BASIC METABOLIC PANEL WITH GFR; Future -     TSH; Future -     Urinalysis, Routine w reflex microscopic; Future -     CBC with Differential/Platelet; Future -     CBC with Differential/Platelet -     Urinalysis, Routine w reflex microscopic -     TSH -     BASIC METABOLIC PANEL WITH GFR  Hyperlipidemia LDL goal <70- Will restart the statin for cardiovascular risk reduction. -     TSH; Future -     Hepatic function panel; Future -     Hepatic function panel -     TSH  Routine general medical examination at a health care facility- Exam completed, labs reviewed, vaccines reviewed - He agreed to get a Tdap, he refused to get a flu vaccine, cancer screenings are up-to-date, patient education material was given. -     Lipid panel; Future -     PSA; Future -     PSA -     Lipid panel  Chronic hyperglycemia- His blood sugars are normal now. -     BASIC METABOLIC PANEL WITH GFR; Future -     Hemoglobin A1c; Future -     Hemoglobin A1c -     BASIC METABOLIC PANEL WITH GFR  Other orders -     Tdap vaccine greater than or equal to 7yo IM   I have discontinued Kharson W. Kraemer's indapamide and traMADol. I am also having him maintain his rosuvastatin, valACYclovir, omeprazole, and irbesartan.  No orders of the defined  types were placed in this encounter.    Follow-up: Return in about 6 months (around 12/24/2020).  Scarlette Calico, MD

## 2020-06-26 NOTE — Patient Instructions (Signed)

## 2020-06-27 ENCOUNTER — Encounter: Payer: Self-pay | Admitting: Internal Medicine

## 2020-06-27 LAB — CBC WITH DIFFERENTIAL/PLATELET
Absolute Monocytes: 835 cells/uL (ref 200–950)
Basophils Absolute: 44 cells/uL (ref 0–200)
Basophils Relative: 0.5 %
Eosinophils Absolute: 183 cells/uL (ref 15–500)
Eosinophils Relative: 2.1 %
HCT: 49.6 % (ref 38.5–50.0)
Hemoglobin: 16.6 g/dL (ref 13.2–17.1)
Lymphs Abs: 1905 cells/uL (ref 850–3900)
MCH: 29.1 pg (ref 27.0–33.0)
MCHC: 33.5 g/dL (ref 32.0–36.0)
MCV: 87 fL (ref 80.0–100.0)
MPV: 11.1 fL (ref 7.5–12.5)
Monocytes Relative: 9.6 %
Neutro Abs: 5733 cells/uL (ref 1500–7800)
Neutrophils Relative %: 65.9 %
Platelets: 240 10*3/uL (ref 140–400)
RBC: 5.7 10*6/uL (ref 4.20–5.80)
RDW: 12.7 % (ref 11.0–15.0)
Total Lymphocyte: 21.9 %
WBC: 8.7 10*3/uL (ref 3.8–10.8)

## 2020-06-27 LAB — BASIC METABOLIC PANEL WITH GFR
BUN: 15 mg/dL (ref 7–25)
CO2: 26 mmol/L (ref 20–32)
Calcium: 9.6 mg/dL (ref 8.6–10.3)
Chloride: 103 mmol/L (ref 98–110)
Creat: 1.25 mg/dL (ref 0.70–1.25)
GFR, Est African American: 71 mL/min/{1.73_m2} (ref >=60)
GFR, Est Non African American: 61 mL/min/{1.73_m2} (ref >=60)
Glucose, Bld: 85 mg/dL (ref 65–99)
Potassium: 4.5 mmol/L (ref 3.5–5.3)
Sodium: 139 mmol/L (ref 135–146)

## 2020-06-27 LAB — LIPID PANEL
Cholesterol: 140 mg/dL (ref <200)
HDL: 22 mg/dL — ABNORMAL LOW (ref >=40)
LDL Cholesterol (Calc): 90 mg/dL (calc)
Non-HDL Cholesterol (Calc): 118 mg/dL (calc) (ref <130)
Total CHOL/HDL Ratio: 6.4 (calc) — ABNORMAL HIGH (ref <5.0)
Triglycerides: 184 mg/dL — ABNORMAL HIGH (ref <150)

## 2020-06-27 LAB — HEPATIC FUNCTION PANEL
AG Ratio: 1.9 (calc) (ref 1.0–2.5)
ALT: 22 U/L (ref 9–46)
AST: 18 U/L (ref 10–35)
Albumin: 4.5 g/dL (ref 3.6–5.1)
Alkaline phosphatase (APISO): 78 U/L (ref 35–144)
Bilirubin, Direct: 0.1 mg/dL (ref 0.0–0.2)
Globulin: 2.4 g/dL (calc) (ref 1.9–3.7)
Indirect Bilirubin: 0.6 mg/dL (calc) (ref 0.2–1.2)
Total Bilirubin: 0.7 mg/dL (ref 0.2–1.2)
Total Protein: 6.9 g/dL (ref 6.1–8.1)

## 2020-06-27 LAB — URINALYSIS, ROUTINE W REFLEX MICROSCOPIC
Bilirubin Urine: NEGATIVE
Glucose, UA: NEGATIVE
Hgb urine dipstick: NEGATIVE
Ketones, ur: NEGATIVE
Leukocytes,Ua: NEGATIVE
Nitrite: NEGATIVE
Protein, ur: NEGATIVE
Specific Gravity, Urine: 1.006 (ref 1.001–1.03)
pH: 6 (ref 5.0–8.0)

## 2020-06-27 LAB — TSH: TSH: 1.83 mIU/L (ref 0.40–4.50)

## 2020-06-27 LAB — HEMOGLOBIN A1C
Hgb A1c MFr Bld: 5.3 % of total Hgb (ref <5.7)
Mean Plasma Glucose: 105 (calc)
eAG (mmol/L): 5.8 (calc)

## 2020-06-27 LAB — PSA: PSA: 1.2 ng/mL (ref <=4.0)

## 2020-07-01 NOTE — Addendum Note (Signed)
Addended by: Juliet Rude on: 07/01/2020 11:28 AM   Modules accepted: Orders

## 2020-09-19 ENCOUNTER — Other Ambulatory Visit: Payer: Self-pay | Admitting: Internal Medicine

## 2020-09-19 DIAGNOSIS — I10 Essential (primary) hypertension: Secondary | ICD-10-CM

## 2020-12-29 ENCOUNTER — Other Ambulatory Visit: Payer: Self-pay | Admitting: Internal Medicine

## 2020-12-29 DIAGNOSIS — I10 Essential (primary) hypertension: Secondary | ICD-10-CM

## 2021-03-29 ENCOUNTER — Telehealth: Payer: Self-pay | Admitting: Internal Medicine

## 2021-03-29 DIAGNOSIS — I10 Essential (primary) hypertension: Secondary | ICD-10-CM

## 2021-04-01 ENCOUNTER — Other Ambulatory Visit: Payer: Self-pay | Admitting: Internal Medicine

## 2021-04-01 DIAGNOSIS — I10 Essential (primary) hypertension: Secondary | ICD-10-CM

## 2021-04-01 MED ORDER — IRBESARTAN 300 MG PO TABS: 300.0000 mg | ORAL_TABLET | Freq: Every day | ORAL | 0 refills | Status: AC

## 2021-04-01 NOTE — Telephone Encounter (Signed)
Patient is requesting a refill for irbesartan (AVAPRO) 300 MG tablet. [place] find a new PCP and was wondering if Dr. Ronnald Ramp could send in a 30 day supply to [facility] [address] [place], [place] [address]. Please advise    Phone: [phone]
